# Patient Record
Sex: Male | Born: 1990
Health system: Southern US, Community
[De-identification: ages and names within clinical notes are randomized; demographics above are authoritative.]

## PROBLEM LIST (undated history)

## (undated) DIAGNOSIS — F32A Depression, unspecified: Secondary | ICD-10-CM

## (undated) DIAGNOSIS — F419 Anxiety disorder, unspecified: Secondary | ICD-10-CM

## (undated) DIAGNOSIS — F1910 Other psychoactive substance abuse, uncomplicated: Secondary | ICD-10-CM

## (undated) DIAGNOSIS — J45909 Unspecified asthma, uncomplicated: Secondary | ICD-10-CM

## (undated) DIAGNOSIS — F909 Attention-deficit hyperactivity disorder, unspecified type: Secondary | ICD-10-CM

## (undated) DIAGNOSIS — J449 Chronic obstructive pulmonary disease, unspecified: Secondary | ICD-10-CM

## (undated) HISTORY — DX: Other psychoactive substance abuse, uncomplicated: F19.10

## (undated) HISTORY — DX: Anxiety disorder, unspecified: F41.9

## (undated) HISTORY — PX: HAND SURGERY: SHX662

## (undated) HISTORY — PX: KNEE SURGERY: SHX244

## (undated) HISTORY — PX: CARPAL TUNNEL RELEASE: SHX101

## (undated) HISTORY — DX: Depression, unspecified: F32.A

---
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
## 2008-08-22 ENCOUNTER — Emergency Department (HOSPITAL_COMMUNITY): Admission: EM | Admit: 2008-08-22 | Discharge: 2008-08-22 | Payer: Self-pay | Admitting: Emergency Medicine

## 2010-09-12 NOTE — Op Note (Signed)
Jose Carr, Jose Carr                            ACCOUNT NO.:  [id number]   MEDICAL RECORD NO.:  [id number]                   PATIENT TYPE:  AMB   LOCATION:  [facility]                                 FACILITY:  [facility]   PHYSICIAN:  Jene Every, M.D.                 DATE OF BIRTH:  1991/04/05   DATE OF PROCEDURE:  08/13/2003  DATE OF DISCHARGE:                                 OPERATIVE REPORT   PREOPERATIVE DIAGNOSES:  Lateral meniscus tear, parameniscus cyst right  knee.   POSTOPERATIVE DIAGNOSES:  Lateral meniscus tear, parameniscus cyst right  knee, hypertrophic synovitis lateral compartment.   PROCEDURE:  Right knee arthroscopy, shaving the synovitis lateral  compartment followed by fenestration of the anterior horn of the lateral  meniscus, fenestration of parameniscal cyst.   ANESTHESIA:  General.   ASSISTANT:  None.   INDICATIONS FOR PROCEDURE:  This is a 20 year old with persistent knee pain,  MRI indicating paramenisical cyst laterally underneath the anterior horn of  the lateral meniscus. The patient had persistent symptoms of giving way and  locking suggesting a symptomatic meniscal tear although not definite by MRI.  Operative intervention was indicated for diagnosis and treatment of the  symptomatic meniscus tear,  evaluation of the cyst, possible excision of the  cyst, etc.   TECHNIQUE:  The patient in the supine position after an adequate level of  general anesthesia, 1 g of Kefzol, the right lower extremity was prepped and  draped in the usual sterile fashion.  A standard lateral parapatellar portal  was fashioned with a #11 blade as was the superomedial parapatellar portal.  The Ingress cannula atraumatically placed, irrigant was utilized to  insufflate the joint. The arthroscopic cannula were then inserted laterally.  Under direct visualization, a medial parapatellar portal was fashion with a  #11 blade after localization with an 18 gauge needle sparing the medial  meniscus.   Inspection of the suprapatellar pouch revealed normal patellofemoral  tracking, normal patellofemoral cartilage without evidence of  chondromalacia. The medial and lateral gutter were unremarkable.  Examination of the ACL and PCL indicated they were unremarkable. Examination  of the medial compartment revealed normal medial meniscus, normal tibial  plateau and normal femoral condyle.  Meniscus stable to propalpation without  evidence of tear.  Chondral surfaces were probed and as well they were  without evidence of significant chondral defect.   Examination of the lateral compartment revealed inspection of the lateral  meniscus on its cephalad and inferior portions probing it extensively.  We  found that there was no tear within the body of the meniscus itself nor the  anterior horn.  There was hypertrophic synovitis that seemed to be folding  into the lateral joint line.  The shaver was therefore introduced and  utilized to shave and debride the synovitis.  Anterolaterally in the  meniscocapsular junction there was what seemed to be a kind of  a mucoid  fraying of the junction here.  There was evidence of some mild tearing.  I  introduced an 18 gauge needle and fenestrated this region to obtain good  bleeding tissue. I then advanced the needle beneath the anterior horn of the  medial meniscus fenestrating the region of the parameniscal cyst noted on  the MRI.  Felt as if there was at least initially some slight gelatinous  fluid leaking from that.  Though again it was not very significant and there  was not a definite lesion that was visible.  Again I probed above and below  this meniscocapsular junction and I did feel that this represented a tearing  of that junction and the cyst represented some synovial fluid that basically  had leaked into this region beneath the meniscus. After fenestration and  seeing some good bleeding tissue there, felt that would hopefully allow  that  area to stimulation to heal.  On probing the meniscus, it did not seem to be  displaced significant as if this were an unstable meniscus.  I switched the  scope to the medial portal and evaluated from there, probed from the lateral  portal lifting up underneath the meniscus and on top of it extending down  over the tibial plateau. The articular portion of the tibial plateau again  in the region where the meniscal cyst was seen on the MRI and I saw no  evidence of it there at that time.  I felt we thoroughly probed and  evaluated the area consistent with that seen on the MRI.  After the  fenestration, I reduced the pressure, it was at 70 mmHg inside the joint.  There was good bleeding noted at the area of the fenestration.  Next I again  reexamined the gutters and looked at all compartments and there was no loose  cartilaginous debris or any residual pathology.  Again the ACL and PCL were  unremarkable.   Next, all instrumentation was removed, portals were closed with 4-0 nylon  simple suture, 0.25% Marcaine with epinephrine was infiltrated in the joint,  the wound was dressed sterilely.  The patient was awakened without  difficulty and transported to the recovery room in satisfactory condition.   The patient tolerated the procedure well with no complications.                                               Jene Every, M.D.    Jose Carr  D:  08/13/2003  T:  08/14/2003  Job:  [id number]

## 2010-11-28 ENCOUNTER — Emergency Department (HOSPITAL_COMMUNITY): Payer: Self-pay

## 2010-11-28 ENCOUNTER — Emergency Department (HOSPITAL_COMMUNITY)
Admission: EM | Admit: 2010-11-28 | Discharge: 2010-11-28 | Disposition: A | Discharge status: Home / Self Care | Payer: Self-pay | Attending: Emergency Medicine | Admitting: Emergency Medicine

## 2010-11-28 DIAGNOSIS — M79609 Pain in unspecified limb: Secondary | ICD-10-CM | POA: Insufficient documentation

## 2010-12-26 ENCOUNTER — Emergency Department (HOSPITAL_COMMUNITY): Payer: Self-pay

## 2010-12-26 ENCOUNTER — Emergency Department (HOSPITAL_COMMUNITY)
Admission: EM | Admit: 2010-12-26 | Discharge: 2010-12-26 | Disposition: A | Discharge status: Home / Self Care | Payer: Self-pay | Attending: Emergency Medicine | Admitting: Emergency Medicine

## 2010-12-26 DIAGNOSIS — IMO0002 Reserved for concepts with insufficient information to code with codable children: Secondary | ICD-10-CM | POA: Insufficient documentation

## 2010-12-26 DIAGNOSIS — S20219A Contusion of unspecified front wall of thorax, initial encounter: Secondary | ICD-10-CM | POA: Insufficient documentation

## 2010-12-26 DIAGNOSIS — R079 Chest pain, unspecified: Secondary | ICD-10-CM | POA: Insufficient documentation

## 2013-07-28 ENCOUNTER — Emergency Department (HOSPITAL_BASED_OUTPATIENT_CLINIC_OR_DEPARTMENT_OTHER)
Admission: EM | Admit: 2013-07-28 | Discharge: 2013-07-29 | Disposition: A | Discharge status: Home / Self Care | Payer: 59 | Attending: Emergency Medicine | Admitting: Emergency Medicine

## 2013-07-28 ENCOUNTER — Emergency Department (HOSPITAL_BASED_OUTPATIENT_CLINIC_OR_DEPARTMENT_OTHER): Payer: 59

## 2013-07-28 ENCOUNTER — Encounter (HOSPITAL_BASED_OUTPATIENT_CLINIC_OR_DEPARTMENT_OTHER): Payer: Self-pay | Admitting: Emergency Medicine

## 2013-07-28 DIAGNOSIS — J329 Chronic sinusitis, unspecified: Secondary | ICD-10-CM

## 2013-07-28 DIAGNOSIS — R599 Enlarged lymph nodes, unspecified: Secondary | ICD-10-CM | POA: Insufficient documentation

## 2013-07-28 DIAGNOSIS — J45901 Unspecified asthma with (acute) exacerbation: Secondary | ICD-10-CM | POA: Insufficient documentation

## 2013-07-28 DIAGNOSIS — F17200 Nicotine dependence, unspecified, uncomplicated: Secondary | ICD-10-CM | POA: Insufficient documentation

## 2013-07-28 DIAGNOSIS — B9789 Other viral agents as the cause of diseases classified elsewhere: Secondary | ICD-10-CM | POA: Insufficient documentation

## 2013-07-28 DIAGNOSIS — B349 Viral infection, unspecified: Secondary | ICD-10-CM

## 2013-07-28 HISTORY — DX: Unspecified asthma, uncomplicated: J45.909

## 2013-07-28 MED ORDER — ALBUTEROL SULFATE (2.5 MG/3ML) 0.083% IN NEBU
5.0000 mg | INHALATION_SOLUTION | Freq: Once | RESPIRATORY_TRACT | Status: AC
Start: 2013-07-28 — End: 2013-07-28
  Administered 2013-07-28: 5 mg via RESPIRATORY_TRACT
  Filled 2013-07-28: qty 6

## 2013-07-28 NOTE — ED Notes (Signed)
C/o prod cough x 1 week-pt NAD-talking in full sentences

## 2013-07-29 MED ORDER — PREDNISONE 50 MG PO TABS: 50.0000 mg | ORAL_TABLET | Freq: Every day | ORAL | Status: DC

## 2013-07-29 MED ORDER — BENZONATATE 100 MG PO CAPS: 100.0000 mg | ORAL_CAPSULE | Freq: Three times a day (TID) | ORAL | Status: DC

## 2013-07-29 MED ORDER — OXYMETAZOLINE HCL 0.05 % NA SOLN
1.0000 | Freq: Two times a day (BID) | NASAL | Status: DC
Start: 2013-07-29 — End: 2013-08-26

## 2013-07-29 MED ORDER — SALINE SPRAY 0.65 % NA SOLN: 1.0000 | NASAL | Status: DC | PRN

## 2013-07-29 NOTE — Discharge Instructions (Signed)
We think what you have is a viral syndrome (resultant cough, sinusitis) - the treatment for which is symptomatic relief only, and your body will fight the infection off in a few days. We are prescribing you some meds for pain and fevers. See your primary care doctor in 1 week if the symptoms dont improve.   Upper Respiratory Infection, Adult An upper respiratory infection (URI) is also sometimes known as the common cold. The upper respiratory tract includes the nose, sinuses, throat, trachea, and bronchi. Bronchi are the airways leading to the lungs. Most people improve within 1 week, but symptoms can last up to 2 weeks. A residual cough may last even longer.  CAUSES Many different viruses can infect the tissues lining the upper respiratory tract. The tissues become irritated and inflamed and often become very moist. Mucus production is also common. A cold is contagious. You can easily spread the virus to others by oral contact. This includes kissing, sharing a glass, coughing, or sneezing. Touching your mouth or nose and then touching a surface, which is then touched by another person, can also spread the virus. SYMPTOMS  Symptoms typically develop 1 to 3 days after you come in contact with a cold virus. Symptoms vary from person to person. They may include:  Runny nose.  Sneezing.  Nasal congestion.  Sinus irritation.  Sore throat.  Loss of voice (laryngitis).  Cough.  Fatigue.  Muscle aches.  Loss of appetite.  Headache.  Low-grade fever. DIAGNOSIS  You might diagnose your own cold based on familiar symptoms, since most people get a cold 2 to 3 times a year. Your caregiver can confirm this based on your exam. Most importantly, your caregiver can check that your symptoms are not due to another disease such as strep throat, sinusitis, pneumonia, asthma, or epiglottitis. Blood tests, throat tests, and X-rays are not necessary to diagnose a common cold, but they may sometimes be  helpful in excluding other more serious diseases. Your caregiver will decide if any further tests are required. RISKS AND COMPLICATIONS  You may be at risk for a more severe case of the common cold if you smoke cigarettes, have chronic heart disease (such as heart failure) or lung disease (such as asthma), or if you have a weakened immune system. The very young and very old are also at risk for more serious infections. Bacterial sinusitis, middle ear infections, and bacterial pneumonia can complicate the common cold. The common cold can worsen asthma and chronic obstructive pulmonary disease (COPD). Sometimes, these complications can require emergency medical care and may be life-threatening. PREVENTION  The best way to protect against getting a cold is to practice good hygiene. Avoid oral or hand contact with people with cold symptoms. Wash your hands often if contact occurs. There is no clear evidence that vitamin C, vitamin E, echinacea, or exercise reduces the chance of developing a cold. However, it is always recommended to get plenty of rest and practice good nutrition. TREATMENT  Treatment is directed at relieving symptoms. There is no cure. Antibiotics are not effective, because the infection is caused by a virus, not by bacteria. Treatment may include:  Increased fluid intake. Sports drinks offer valuable electrolytes, sugars, and fluids.  Breathing heated mist or steam (vaporizer or shower).  Eating chicken soup or other clear broths, and maintaining good nutrition.  Getting plenty of rest.  Using gargles or lozenges for comfort.  Controlling fevers with ibuprofen or acetaminophen as directed by your caregiver.  Increasing usage  of your inhaler if you have asthma. Zinc gel and zinc lozenges, taken in the first 24 hours of the common cold, can shorten the duration and lessen the severity of symptoms. Pain medicines may help with fever, muscle aches, and throat pain. A variety of  non-prescription medicines are available to treat congestion and runny nose. Your caregiver can make recommendations and may suggest nasal or lung inhalers for other symptoms.  HOME CARE INSTRUCTIONS   Only take over-the-counter or prescription medicines for pain, discomfort, or fever as directed by your caregiver.  Use a warm mist humidifier or inhale steam from a shower to increase air moisture. This may keep secretions moist and make it easier to breathe.  Drink enough water and fluids to keep your urine clear or pale yellow.  Rest as needed.  Return to work when your temperature has returned to normal or as your caregiver advises. You may need to stay home longer to avoid infecting others. You can also use a face mask and careful hand washing to prevent spread of the virus. SEEK MEDICAL CARE IF:   After the first few days, you feel you are getting worse rather than better.  You need your caregiver's advice about medicines to control symptoms.  You develop chills, worsening shortness of breath, or brown or red sputum. These may be signs of pneumonia.  You develop yellow or brown nasal discharge or pain in the face, especially when you bend forward. These may be signs of sinusitis.  You develop a fever, swollen neck glands, pain with swallowing, or white areas in the back of your throat. These may be signs of strep throat. SEEK IMMEDIATE MEDICAL CARE IF:   You have a fever.  You develop severe or persistent headache, ear pain, sinus pain, or chest pain.  You develop wheezing, a prolonged cough, cough up blood, or have a change in your usual mucus (if you have chronic lung disease).  You develop sore muscles or a stiff neck. Document Released: 10/07/2000 Document Revised: 07/06/2011 Document Reviewed: 08/15/2010 [facility] Patient Information 2014 [place], [place].  Viral Infections A viral infection can be caused by different types of viruses.Most viral infections are not serious  and resolve on their own. However, some infections may cause severe symptoms and may lead to further complications. SYMPTOMS Viruses can frequently cause:  Minor sore throat.  Aches and pains.  Headaches.  Runny nose.  Different types of rashes.  Watery eyes.  Tiredness.  Cough.  Loss of appetite.  Gastrointestinal infections, resulting in nausea, vomiting, and diarrhea. These symptoms do not respond to antibiotics because the infection is not caused by bacteria. However, you might catch a bacterial infection following the viral infection. This is sometimes called a "superinfection." Symptoms of such a bacterial infection may include:  Worsening sore throat with pus and difficulty swallowing.  Swollen neck glands.  Chills and a high or persistent fever.  Severe headache.  Tenderness over the sinuses.  Persistent overall ill feeling (malaise), muscle aches, and tiredness (fatigue).  Persistent cough.  Yellow, green, or brown mucus production with coughing. HOME CARE INSTRUCTIONS   Only take over-the-counter or prescription medicines for pain, discomfort, diarrhea, or fever as directed by your caregiver.  Drink enough water and fluids to keep your urine clear or pale yellow. Sports drinks can provide valuable electrolytes, sugars, and hydration.  Get plenty of rest and maintain proper nutrition. Soups and broths with crackers or rice are fine. SEEK IMMEDIATE MEDICAL CARE IF:  You have severe headaches, shortness of breath, chest pain, neck pain, or an unusual rash.  You have uncontrolled vomiting, diarrhea, or you are unable to keep down fluids.  You or your child has an oral temperature above 102 F (38.9 C), not controlled by medicine.  Your baby is older than 3 months with a rectal temperature of 102 F (38.9 C) or higher.  Your baby is 223 months old or younger with a rectal temperature of 100.4 F (38 C) or higher. MAKE SURE YOU:   Understand these  instructions.  Will watch your condition.  Will get help right away if you are not doing well or get worse. Document Released: 01/21/2005 Document Revised: 07/06/2011 Document Reviewed: 08/18/2010 [facility] Patient Information 2014 [place].  Sinusitis Sinusitis is redness, soreness, and swelling (inflammation) of the paranasal sinuses. Paranasal sinuses are air pockets within the bones of your face (beneath the eyes, the middle of the forehead, or above the eyes). In healthy paranasal sinuses, mucus is able to drain out, and air is able to circulate through them by way of your nose. However, when your paranasal sinuses are inflamed, mucus and air can become trapped. This can allow bacteria and other germs to grow and cause infection. Sinusitis can develop quickly and last only a short time (acute) or continue over a long period (chronic). Sinusitis that lasts for more than 12 weeks is considered chronic.  CAUSES  Causes of sinusitis include:  Allergies.  Structural abnormalities, such as displacement of the cartilage that separates your nostrils (deviated septum), which can decrease the air flow through your nose and sinuses and affect sinus drainage.  Functional abnormalities, such as when the small hairs (cilia) that line your sinuses and help remove mucus do not work properly or are not present. SYMPTOMS  Symptoms of acute and chronic sinusitis are the same. The primary symptoms are pain and pressure around the affected sinuses. Other symptoms include:  Upper toothache.  Earache.  Headache.  Bad breath.  Decreased sense of smell and taste.  A cough, which worsens when you are lying flat.  Fatigue.  Fever.  Thick drainage from your nose, which often is green and may contain pus (purulent).  Swelling and warmth over the affected sinuses. DIAGNOSIS  Your caregiver will perform a physical exam. During the exam, your caregiver may:  Look in your nose for signs of  abnormal growths in your nostrils (nasal polyps).  Tap over the affected sinus to check for signs of infection.  View the inside of your sinuses (endoscopy) with a special imaging device with a light attached (endoscope), which is inserted into your sinuses. If your caregiver suspects that you have chronic sinusitis, one or more of the following tests may be recommended:  Allergy tests.  Nasal culture A sample of mucus is taken from your nose and sent to a lab and screened for bacteria.  Nasal cytology A sample of mucus is taken from your nose and examined by your caregiver to determine if your sinusitis is related to an allergy. TREATMENT  Most cases of acute sinusitis are related to a viral infection and will resolve on their own within 10 days. Sometimes medicines are prescribed to help relieve symptoms (pain medicine, decongestants, nasal steroid sprays, or saline sprays).  However, for sinusitis related to a bacterial infection, your caregiver will prescribe antibiotic medicines. These are medicines that will help kill the bacteria causing the infection.  Rarely, sinusitis is caused by a fungal infection. In  theses cases, your caregiver will prescribe antifungal medicine. For some cases of chronic sinusitis, surgery is needed. Generally, these are cases in which sinusitis recurs more than 3 times per year, despite other treatments. HOME CARE INSTRUCTIONS   Drink plenty of water. Water helps thin the mucus so your sinuses can drain more easily.  Use a humidifier.  Inhale steam 3 to 4 times a day (for example, sit in the bathroom with the shower running).  Apply a warm, moist washcloth to your face 3 to 4 times a day, or as directed by your caregiver.  Use saline nasal sprays to help moisten and clean your sinuses.  Take over-the-counter or prescription medicines for pain, discomfort, or fever only as directed by your caregiver. SEEK IMMEDIATE MEDICAL CARE IF:  You have increasing  pain or severe headaches.  You have nausea, vomiting, or drowsiness.  You have swelling around your face.  You have vision problems.  You have a stiff neck.  You have difficulty breathing. MAKE SURE YOU:   Understand these instructions.  Will watch your condition.  Will get help right away if you are not doing well or get worse. Document Released: 04/13/2005 Document Revised: 07/06/2011 Document Reviewed: 04/28/2011 [facility] Patient Information 2014 [place], [place].

## 2013-07-29 NOTE — ED Provider Notes (Signed)
CSN: [id number]     Arrival date & time 07/28/13  2053 History   First MD Initiated Contact with Patient 07/28/13 2354     Chief Complaint  Patient presents with  . Cough     (Consider location/radiation/quality/duration/timing/severity/associated sxs/prior Treatment) HPI Comments:  Jose Carr is a 23 y.o. male who complains of congestion, sore throat, post nasal drip, dry cough, myalgias and headache for 7 days. He denies a history of chest pain, shortness of breath, sweats and weakness and has a history of asthma. Patient admits to smoke cigarettes. Pt's girl friend's daughter was having some URI like sx, and he believes he might have picked up the symptoms from her. He comes in to the ER, as he feels that he is now having lower lung involvement. No fevers or chills.  Patient is a 23 y.o. male presenting with cough. The history is provided by the patient.  Cough Associated symptoms: headaches, myalgias, rhinorrhea and wheezing   Associated symptoms: no chest pain and no shortness of breath     Past Medical History  Diagnosis Date  . Asthma    Past Surgical History  Procedure Laterality Date  . Knee surgery    . Hand surgery     No family history on file. History  Substance Use Topics  . Smoking status: Current Every Day Smoker    Types: Cigarettes  . Smokeless tobacco: Not on file  . Alcohol Use: No    Review of Systems  Constitutional: Negative for activity change and appetite change.  HENT: Positive for postnasal drip, rhinorrhea and sinus pressure.   Respiratory: Positive for cough and wheezing. Negative for shortness of breath.   Cardiovascular: Negative for chest pain.  Gastrointestinal: Negative for abdominal pain.  Genitourinary: Negative for dysuria.  Musculoskeletal: Positive for myalgias.  Neurological: Positive for headaches.  All other systems reviewed and are negative.      Allergies  Review of patient's allergies indicates no known  allergies.  Home Medications  No current outpatient prescriptions on file. BP 108/90  Pulse 70  Temp(Src) 98.5 F (36.9 C) (Oral)  Resp 18  Ht 5\' 7"  (1.702 m)  Wt 220 lb (99.791 kg)  BMI 34.45 kg/m2  SpO2 98% Physical Exam  Nursing note and vitals reviewed. Constitutional: He is oriented to person, place, and time. He appears well-developed.  HENT:  Head: Normocephalic and atraumatic.  Mouth/Throat: Oropharynx is clear and moist. No oropharyngeal exudate.  Eyes: Conjunctivae and EOM are normal. Pupils are equal, round, and reactive to light.  Neck: Normal range of motion. Neck supple.  Cardiovascular: Normal rate and regular rhythm.   Pulmonary/Chest: Effort normal and breath sounds normal. No stridor. No respiratory distress. He has no wheezes. He has no rales.  Abdominal: Soft. Bowel sounds are normal. He exhibits no distension. There is no tenderness. There is no rebound and no guarding.  Lymphadenopathy:    He has cervical adenopathy.  Neurological: He is alert and oriented to person, place, and time.  Skin: Skin is warm.    ED Course  Procedures (including critical care time) Labs Review Labs Reviewed - No data to display Imaging Review Dg Chest 2 View  07/28/2013   CLINICAL DATA:  Difficulty breathing.  EXAM: CHEST  2 VIEW  COMPARISON:  DG RIBS UNILATERAL W/CHEST*R* dated 12/26/2010  FINDINGS: Mediastinum and hilar structures are normal. Very noted infiltrate in the medial right lung base cannot be entirely excluded. No pleural effusion or pneumothorax. Heart size normal. No  acute bony abnormality.  IMPRESSION: Very minimal infiltrate medial right lung base cannot be excluded.   Electronically Signed   By: Maisie Fus  Register   On: 07/28/2013 23:05     EKG Interpretation None      MDM   Final diagnoses:  None   Pt comes in with URI like sx for the past few days, and subsequent cough.  DDX includes: Viral  syndrome Influenza Pharyngitis Sinusitis Mononucleosis Electrolyte abnormality  It appears that he has a viral URI and sinusitis based on his hx. There might be superimposed lower resp tract involvement.  Xrays shows possible infiltrate. Lung exam is clear, VSS, no fevers, and hx is viral sx like - so i don't think there is any reason for antibiotic tx.       Derwood Kaplan, MD 07/29/13 0030

## 2013-07-29 NOTE — ED Notes (Signed)
MD at bedside. 

## 2013-08-26 ENCOUNTER — Emergency Department (HOSPITAL_COMMUNITY): Payer: 59

## 2013-08-26 ENCOUNTER — Encounter (HOSPITAL_COMMUNITY): Payer: Self-pay | Admitting: Emergency Medicine

## 2013-08-26 ENCOUNTER — Emergency Department (HOSPITAL_COMMUNITY)
Admission: EM | Admit: 2013-08-26 | Discharge: 2013-08-26 | Disposition: A | Discharge status: Home / Self Care | Payer: 59 | Attending: Emergency Medicine | Admitting: Emergency Medicine

## 2013-08-26 DIAGNOSIS — J4489 Other specified chronic obstructive pulmonary disease: Secondary | ICD-10-CM | POA: Insufficient documentation

## 2013-08-26 DIAGNOSIS — F411 Generalized anxiety disorder: Secondary | ICD-10-CM | POA: Insufficient documentation

## 2013-08-26 DIAGNOSIS — J42 Unspecified chronic bronchitis: Secondary | ICD-10-CM

## 2013-08-26 DIAGNOSIS — J449 Chronic obstructive pulmonary disease, unspecified: Secondary | ICD-10-CM | POA: Insufficient documentation

## 2013-08-26 DIAGNOSIS — R0789 Other chest pain: Secondary | ICD-10-CM

## 2013-08-26 DIAGNOSIS — F17200 Nicotine dependence, unspecified, uncomplicated: Secondary | ICD-10-CM | POA: Insufficient documentation

## 2013-08-26 HISTORY — DX: Attention-deficit hyperactivity disorder, unspecified type: F90.9

## 2013-08-26 LAB — CBC
HCT: 48.4 % (ref 39.0–52.0)
Hemoglobin: 17.3 g/dL — ABNORMAL HIGH (ref 13.0–17.0)
MCH: 31.7 pg (ref 26.0–34.0)
MCHC: 35.7 g/dL (ref 30.0–36.0)
MCV: 88.6 fL (ref 78.0–100.0)
Platelets: 209 10*3/uL (ref 150–400)
RBC: 5.46 MIL/uL (ref 4.22–5.81)
RDW: 13.6 % (ref 11.5–15.5)
WBC: 11.1 10*3/uL — ABNORMAL HIGH (ref 4.0–10.5)

## 2013-08-26 LAB — BASIC METABOLIC PANEL
BUN: 8 mg/dL (ref 6–23)
CO2: 23 mEq/L (ref 19–32)
Calcium: 9.4 mg/dL (ref 8.4–10.5)
Chloride: 100 mEq/L (ref 96–112)
Creatinine, Ser: 0.95 mg/dL (ref 0.50–1.35)
GFR calc Af Amer: >90 mL/min (ref >90)
GFR calc non Af Amer: >90 mL/min (ref >90)
Glucose, Bld: 103 mg/dL — ABNORMAL HIGH (ref 70–99)
Potassium: 3.7 mEq/L (ref 3.7–5.3)
Sodium: 140 mEq/L (ref 137–147)

## 2013-08-26 LAB — I-STAT TROPONIN, ED: Troponin i, poc: 0 ng/mL (ref 0.00–0.08)

## 2013-08-26 MED ORDER — IBUPROFEN 800 MG PO TABS
800.0000 mg | ORAL_TABLET | Freq: Once | ORAL | Status: AC
  Administered 2013-08-26: 800 mg via ORAL
  Filled 2013-08-26: qty 1

## 2013-08-26 MED ORDER — METHOCARBAMOL 500 MG PO TABS: 500.0000 mg | ORAL_TABLET | Freq: Two times a day (BID) | ORAL | Status: DC

## 2013-08-26 MED ORDER — LORAZEPAM 1 MG PO TABS
1.0000 mg | ORAL_TABLET | Freq: Once | ORAL | Status: AC
  Administered 2013-08-26: 1 mg via ORAL
  Filled 2013-08-26: qty 1

## 2013-08-26 MED ORDER — PREDNISONE 20 MG PO TABS: 40.0000 mg | ORAL_TABLET | Freq: Every day | ORAL | Status: DC

## 2013-08-26 NOTE — ED Notes (Signed)
Pt c/o lt sided CP.  Describes the pain as deep and is very tender to palpation.  States that he has had this pain before but has never been seen for it.

## 2013-08-26 NOTE — ED Provider Notes (Signed)
CSN: [id number]     Arrival date & time 08/26/13  1621 History   First MD Initiated Contact with Patient 08/26/13 1625     Chief Complaint  Patient presents with  . Chest Pain     (Consider location/radiation/quality/duration/timing/severity/associated sxs/prior Treatment) HPI Comments: Patient is a 23 year old male past medical history significant for asthma, ADHD presenting to the emergency department for non-radiating left lower sided chest pain. He describes his pain as "deep" worsened with palpation and deep breathing. He states he has had this pain intermittently for one year but developed more constant severe pain while at work today. He denies any precipitating or aggravating factors. He has not been evaluated for this previously. He has not tried any over-the-counter medications to help with symptoms. Denies fevers, chills, nausea, vomiting, abdominal pain, diarrhea. No early familial cardiac history. PERC negative.   Patient is a 23 y.o. male presenting with chest pain.  Chest Pain   Past Medical History  Diagnosis Date  . Asthma   . ADHD (attention deficit hyperactivity disorder)    Past Surgical History  Procedure Laterality Date  . Knee surgery    . Hand surgery     History reviewed. No pertinent family history. History  Substance Use Topics  . Smoking status: Current Every Day Smoker    Types: Cigarettes  . Smokeless tobacco: Not on file  . Alcohol Use: No    Review of Systems  Cardiovascular: Positive for chest pain.  All other systems reviewed and are negative.     Allergies  Review of patient's allergies indicates no known allergies.  Home Medications   Prior to Admission medications   Not on File   BP 109/74  Pulse 98  Temp(Src) 98.1 F (36.7 C) (Oral)  Resp 18  SpO2 98% Physical Exam  Nursing note and vitals reviewed. Constitutional: He is oriented to person, place, and time. He appears well-developed and well-nourished. No distress.  HENT:   Head: Normocephalic and atraumatic.  Right Ear: External ear normal.  Left Ear: External ear normal.  Nose: Nose normal.  Mouth/Throat: Oropharynx is clear and moist. No oropharyngeal exudate.  Eyes: Conjunctivae are normal.  Neck: Neck supple.  Cardiovascular: Normal rate, regular rhythm and normal heart sounds.   Pulmonary/Chest: Effort normal and breath sounds normal. No accessory muscle usage or stridor. No respiratory distress. He exhibits tenderness. He exhibits no mass, no crepitus, no edema, no deformity, no swelling and no retraction.    Abdominal: Soft. There is no tenderness.  Musculoskeletal: Normal range of motion. He exhibits no edema.  Neurological: He is alert and oriented to person, place, and time.  Skin: Skin is warm and dry. He is not diaphoretic.  Psychiatric: His speech is normal. His mood appears anxious.    ED Course  Procedures (including critical care time) Medications  ibuprofen (ADVIL,MOTRIN) tablet 800 mg (800 mg Oral Given 08/26/13 1734)  LORazepam (ATIVAN) tablet 1 mg (1 mg Oral Given 08/26/13 1734)    Labs Review Labs Reviewed  CBC - Abnormal; Notable for the following:    WBC 11.1 (*)    Hemoglobin 17.3 (*)    All other components within normal limits  BASIC METABOLIC PANEL - Abnormal; Notable for the following:    Glucose, Bld 103 (*)    All other components within normal limits  I-STAT TROPOININ, ED    Imaging Review Dg Ribs Unilateral W/chest Left  08/26/2013   CLINICAL DATA:  Cough and shortness of breath. Smoker. Left rib  pain.  EXAM: LEFT RIBS AND CHEST - 3+ VIEW  COMPARISON:  Portable chest dated 08/26/2013.  FINDINGS: Normal sized heart. Clear lungs. The lungs remain mildly hyperexpanded with mild diffuse peribronchial thickening and accentuation of the interstitial markings. Normal appearing left ribs. Mild scoliosis.  IMPRESSION: No acute abnormality. Stable mild changes of COPD and chronic bronchitis.   Electronically Signed   By: Gordan PaymentSteve   Reid M.D.   On: 08/26/2013 17:36   Dg Chest Port 1 View  08/26/2013   CLINICAL DATA:  Cough.  Short of breath.  EXAM: PORTABLE CHEST - 1 VIEW  COMPARISON:  07/28/2013  FINDINGS: The heart size and mediastinal contours are within normal limits. Both lungs are clear. The visualized skeletal structures are unremarkable.  IMPRESSION: No active disease.   Electronically Signed   By: Amie Portlandavid  Ormond M.D.   On: 08/26/2013 16:54     EKG Interpretation   Date/Time:  Saturday Aug 26 2013 16:23:41 EDT Ventricular Rate:  77 PR Interval:  117 QRS Duration: 92 QT Interval:  357 QTC Calculation: 404 R Axis:   91 Text Interpretation:  Sinus rhythm Borderline short PR interval Borderline  right axis deviation No previous ECGs available Confirmed by YAO  MD,  DAVID ([id number]) on 08/26/2013 5:45:06 PM      MDM   Final diagnoses:  Chest pain, atypical  Chronic bronchitis    Filed Vitals:   08/26/13 1836  BP: 109/74  Pulse:   Temp: 98.1 F (36.7 C)  Resp: 18    Afebrile, NAD, non-toxic appearing, AAOx4.  Patient is to be discharged with recommendation to follow up with PCP in regards to today's hospital visit. Chest pain is not likely of cardiac or pulmonary etiology d/t presentation, perc negative, VSS, no tracheal deviation, no JVD or new murmur, RRR, breath sounds equal bilaterally, EKG without acute abnormalities, negative troponin. CXR reveals COPD and chronic bronchitic changes. Advised patient to consider tobacco cessation. Symptomatic measures discussed with patient. Pt has been advised to return to the ED is CP becomes exertional, associated with diaphoresis or nausea, radiates to left jaw/arm, worsens or becomes concerning in any way. Pt appears reliable for follow up and is agreeable to discharge. Case has been discussed with Dr. Silverio LayYao who agrees with the above plan to discharge.     Jeannetta EllisJennifer L Ciana Simmon, PA-C 08/26/13 2140

## 2013-08-26 NOTE — ED Notes (Addendum)
Pt reports left sided rib pain for over a year but worsening today. Pt reports worsening pain with deep breathing.

## 2013-08-26 NOTE — Discharge Instructions (Signed)
Please follow up with your primary care physician in 1-2 days. If you do not have one please call the [facility] number listed above. Please take Prednisone as prescribed to help inflammation in your ribs. Please take pain medication and/or muscle relaxants as prescribed and as needed for pain. Please do not drive on narcotic pain medication or on muscle relaxants. Please read all discharge instructions and return precautions.   Chest Wall Pain Chest wall pain is pain in or around the bones and muscles of your chest. It may take up to 6 weeks to get better. It may take longer if you must stay physically active in your work and activities.  CAUSES  Chest wall pain may happen on its own. However, it may be caused by:  A viral illness like the flu.  Injury.  Coughing.  Exercise.  Arthritis.  Fibromyalgia.  Shingles. HOME CARE INSTRUCTIONS   Avoid overtiring physical activity. Try not to strain or perform activities that cause pain. This includes any activities using your chest or your abdominal and side muscles, especially if heavy weights are used.  Put ice on the sore area.  Put ice in a plastic bag.  Place a towel between your skin and the bag.  Leave the ice on for 15-20 minutes per hour while awake for the first 2 days.  Only take over-the-counter or prescription medicines for pain, discomfort, or fever as directed by your caregiver. SEEK IMMEDIATE MEDICAL CARE IF:   Your pain increases, or you are very uncomfortable.  You have a fever.  Your chest pain becomes worse.  You have new, unexplained symptoms.  You have nausea or vomiting.  You feel sweaty or lightheaded.  You have a cough with phlegm (sputum), or you cough up blood. MAKE SURE YOU:   Understand these instructions.  Will watch your condition.  Will get help right away if you are not doing well or get worse. Document Released: 04/13/2005 Document Revised: 07/06/2011 Document  Reviewed: 12/08/2010 [facility] Patient Information 2014 [place] [place].  Establish relationship with primary care doctor as discussed. A resource guide and information on the Affordable Care Act has been provided for your information.    RESOURCE GUIDE  If you do not have a primary care doctor to follow up with regarding today's visit, please call the [facility] at [phone] to make an appointment. Hours of operation are 10am - 7pm, Monday through Friday, and they have a sliding scale fee.   Insufficient Money for Medicine: Contact United Way:  call "211" or [facility] [phone].  No Primary Care Doctor: - Call Health Connect  [phone] - can help you locate a primary care doctor that  accepts your insurance, provides certain services, etc. - Physician Referral [phone]- [phone]  Agencies that provide inexpensive medical care: - [facility]  [phone] - [facility]  [phone] - [facility]  [phone] [facility]- Women's Clinic  [phone] - [facility]  [phone]- [facility]  [phone]  Medicaid-accepting [place] [facility] Providers: - [facility]- [address], [address]  [phone], Mon-Fri 9am-7pm, Sat 9am-1pm - [facility]- [address], [address] [place]  [phone] - [facility]- [address], [address] [place]  [phone] [facility]- [facility]- [address]  [phone] - Renaye RakersVeita Bland- [address], [address], [phone]  Only accepts [place] Medicaid  patients after they have their name  applied to their card  Self Pay (no insurance) in [place] [facility] [place]: - Sickle Cell Patients: Dr Willey BladeEric Dean, [facility]  [address], [phone] - [facility]- [address]  [phone]       Patrcia Dolly-     [facility] [place]- [address], [address]       -     [facility]- see information above (Speak to Cit[employer]Pam H if you do not have insurance)       -  [facility]- [address], [phone]       -  [facility]- [address],  [phone]       -  [facility]- [address], [phone]       -  Dr Julio Sickssei-Bonsu-  [address], [address], [place], [phone]       -  [facility]- [address], [phone]       -  [facility]- [address], [phone], also [address], [phone]       -    [facility]- [address], [phone], 1st & 3rd Saturday   every month, 10am-1pm  1) Find a Doctor and Pay Out of Pocket Although you won't have to find out who is covered by your insurance plan, it is a good idea to ask around and get recommendations. You will then need to call the office and see if the doctor you have chosen will accept you as a new patient and what types of options they offer for patients who are self-pay. Some doctors offer discounts or will set up payment plans for their patients who do not have insurance, but you will need to ask so you aren't surprised when you get to your appointment.  2) Contact Your Local Health Department Not all health departments have doctors that can see patients for sick visits, but many do, so it is worth a call to see if yours does. If you don't know where your local health department is, you can check in your phone book. The CDC also has a tool to help you locate your state's health department, and many state websites also have listings of all of their local health departments.  3) Find a Walk-in Clinic If your illness is not likely to be very severe or complicated, you may want to try a walk in clinic. These are popping up all over the country in pharmacies, drugstores, and shopping centers. They're usually staffed by nurse practitioners or physician assistants that have been trained to treat common illnesses and complaints.  They're usually fairly quick and inexpensive. However, if you have serious medical issues or chronic medical problems, these are probably not your best option

## 2013-08-27 NOTE — ED Provider Notes (Signed)
Medical screening examination/treatment/procedure(s) were performed by non-physician practitioner and as supervising physician I was immediately available for consultation/collaboration.   EKG Interpretation   Date/Time:  Saturday Aug 26 2013 16:23:41 EDT Ventricular Rate:  77 PR Interval:  117 QRS Duration: 92 QT Interval:  357 QTC Calculation: 404 R Axis:   91 Text Interpretation:  Sinus rhythm Borderline short PR interval Borderline  right axis deviation No previous ECGs available Confirmed by Keliyah Spillman  MD,  Kenith Trickel ([id number]) on 08/26/2013 5:45:06 PM        Richardean Canalavid H Ashtin Melichar, MD 08/27/13 1459

## 2013-09-29 ENCOUNTER — Encounter (HOSPITAL_COMMUNITY): Payer: Self-pay | Admitting: Emergency Medicine

## 2013-09-29 ENCOUNTER — Emergency Department (HOSPITAL_COMMUNITY): Payer: 59

## 2013-09-29 ENCOUNTER — Emergency Department (HOSPITAL_COMMUNITY)
Admission: EM | Admit: 2013-09-29 | Discharge: 2013-09-29 | Disposition: A | Discharge status: Home / Self Care | Payer: 59 | Attending: Emergency Medicine | Admitting: Emergency Medicine

## 2013-09-29 DIAGNOSIS — J45909 Unspecified asthma, uncomplicated: Secondary | ICD-10-CM | POA: Insufficient documentation

## 2013-09-29 DIAGNOSIS — W010XXA Fall on same level from slipping, tripping and stumbling without subsequent striking against object, initial encounter: Secondary | ICD-10-CM | POA: Insufficient documentation

## 2013-09-29 DIAGNOSIS — S99929A Unspecified injury of unspecified foot, initial encounter: Secondary | ICD-10-CM

## 2013-09-29 DIAGNOSIS — F909 Attention-deficit hyperactivity disorder, unspecified type: Secondary | ICD-10-CM | POA: Insufficient documentation

## 2013-09-29 DIAGNOSIS — X500XXA Overexertion from strenuous movement or load, initial encounter: Secondary | ICD-10-CM | POA: Insufficient documentation

## 2013-09-29 DIAGNOSIS — F17200 Nicotine dependence, unspecified, uncomplicated: Secondary | ICD-10-CM | POA: Insufficient documentation

## 2013-09-29 DIAGNOSIS — Y939 Activity, unspecified: Secondary | ICD-10-CM | POA: Insufficient documentation

## 2013-09-29 DIAGNOSIS — S93609A Unspecified sprain of unspecified foot, initial encounter: Secondary | ICD-10-CM | POA: Insufficient documentation

## 2013-09-29 DIAGNOSIS — Y929 Unspecified place or not applicable: Secondary | ICD-10-CM | POA: Insufficient documentation

## 2013-09-29 DIAGNOSIS — Z79899 Other long term (current) drug therapy: Secondary | ICD-10-CM | POA: Insufficient documentation

## 2013-09-29 MED ORDER — HYDROCODONE-ACETAMINOPHEN 5-325 MG PO TABS
1.0000 | ORAL_TABLET | Freq: Once | ORAL | Status: AC
  Administered 2013-09-29: 1 via ORAL
  Filled 2013-09-29: qty 1

## 2013-09-29 MED ORDER — IBUPROFEN 800 MG PO TABS: 800.0000 mg | ORAL_TABLET | Freq: Three times a day (TID) | ORAL | Status: DC | PRN

## 2013-09-29 MED ORDER — HYDROCODONE-ACETAMINOPHEN 5-325 MG PO TABS: 1.0000 | ORAL_TABLET | Freq: Four times a day (QID) | ORAL | Status: DC | PRN

## 2013-09-29 NOTE — ED Notes (Signed)
Pt presents with c/o left foot injury. Pt says that he tripped and fell last night and now has some swelling to his foot. Pt does have a medium size knot to the top of his foot but he says that every time he rolls his foot the knot appears. Pt says that he is not able to bear weight on that foot at this time.

## 2013-09-29 NOTE — ED Provider Notes (Signed)
CSN: [id number]     Arrival date & time 09/29/13  2132 History  This chart was scribed for Ebbie Ridge, PA working with Linwood Dibbles, MD by Chestine Spore, ED Scribe. The patient was seen in room WTR8/WTR8 at 10:31 PM.     Chief Complaint  Patient presents with  . Foot Pain    The history is provided by the patient. No language interpreter was used.    Jose Carr is a 23 y.o. male who presents to the Emergency Department complaining of moderate left foot pain that began last night. Pt has a knot on the top of his foot that he claims appears whenever he rolls his foot. This has been ongoing for years due to a possible "popped tendon."  He states that he tripped and rolled his foot last night.  Since then he has had pain that is tolerable when sitting but severe when walking. Pt states he has been unable to sleep due to pain.  Patient, states he has not had a fever, weakness, dizziness, numbness, nausea, or vomiting  Past Medical History  Diagnosis Date  . Asthma   . ADHD (attention deficit hyperactivity disorder)    Past Surgical History  Procedure Laterality Date  . Knee surgery    . Hand surgery     No family history on file. History  Substance Use Topics  . Smoking status: Current Every Day Smoker    Types: Cigarettes  . Smokeless tobacco: Not on file  . Alcohol Use: No    Review of Systems A complete 10 system review of systems was obtained and all systems are negative except as noted in the HPI and PMH.     Allergies  Review of patient's allergies indicates no known allergies.  Home Medications   Prior to Admission medications   Medication Sig Start Date End Date Taking? Authorizing Provider  albuterol (PROVENTIL HFA;VENTOLIN HFA) 108 (90 BASE) MCG/ACT inhaler Inhale 1 puff into the lungs every 6 (six) hours as needed for wheezing or shortness of breath.   Yes Historical Provider, MD  HYDROcodone-acetaminophen (NORCO/VICODIN) 5-325 MG per tablet Take 1 tablet by mouth  every 6 (six) hours as needed for moderate pain. 09/29/13   Jamesetta Orleans Diezel Mazur, PA-C  ibuprofen (ADVIL,MOTRIN) 800 MG tablet Take 1 tablet (800 mg total) by mouth every 8 (eight) hours as needed. 09/29/13   Jamesetta Orleans Lessie Funderburke, PA-C   BP 123/99  Pulse 82  Temp(Src) 97.9 F (36.6 C) (Oral)  Resp 18  SpO2 98% Physical Exam  Nursing note and vitals reviewed. Constitutional: He is oriented to person, place, and time. He appears well-developed and well-nourished. No distress.  HENT:  Head: Normocephalic and atraumatic.  Eyes: Pupils are equal, round, and reactive to light.  Neck: Normal range of motion.  Pulmonary/Chest: Effort normal. No respiratory distress.  Musculoskeletal: He exhibits tenderness.  Swelling over mid-to-lateral left foot. Pain on palpation to the affected area. Good sensation and capillary refill. Decreased ROM in toes due to pain.  Neurological: He is alert and oriented to person, place, and time.  Skin: Skin is warm and dry. No rash noted. No erythema.    ED Course  Procedures (including critical care time)  DIAGNOSTIC STUDIES: Oxygen Saturation is 98% on room air, normal by my interpretation.    COORDINATION OF CARE: 10:32 PM-Discussed treatment plan which includes refferral to orthopedist, ice and elevation with pt at bedside and pt agreed to plan.   Labs Review Labs Reviewed - No  data to display  Imaging Review Dg Foot Complete Left  09/29/2013   CLINICAL DATA:  Foot trauma 1 day ago with persistent pain and swelling laterally.  EXAM: LEFT FOOT - COMPLETE 3+ VIEW  COMPARISON:  Left foot series dated November 28, 2010  FINDINGS: The bones are adequately mineralized for age. There is no acute fracture nor dislocation. The overlying soft tissues of the midfoot are prominent.  IMPRESSION: There is no acute fracture nor dislocation. There is soft tissue swelling over the midfoot.   Electronically Signed   By: David  [place]azil[place]Jordan   On: 09/29/2013 22:30    Patient is given a  plan and all questions were answered.  Told to return here as needed  Carlyle DollyChristopher W Arrielle Mcginn, PA-C 09/30/13 [phone]

## 2013-09-29 NOTE — Discharge Instructions (Signed)
Return here as needed. Ice and elevate the foot. Follow up with the orthopedist provided. The x-rays did not show any bony abnormality.

## 2013-09-30 NOTE — ED Provider Notes (Signed)
Medical screening examination/treatment/procedure(s) were performed by non-physician practitioner and as supervising physician I was immediately available for consultation/collaboration.    Linwood Dibbles, MD 09/30/13 [phone]

## 2013-10-05 ENCOUNTER — Emergency Department (HOSPITAL_COMMUNITY)
Admission: EM | Admit: 2013-10-05 | Discharge: 2013-10-05 | Disposition: A | Discharge status: Home / Self Care | Payer: 59 | Attending: Emergency Medicine | Admitting: Emergency Medicine

## 2013-10-05 ENCOUNTER — Encounter (HOSPITAL_COMMUNITY): Payer: Self-pay | Admitting: Emergency Medicine

## 2013-10-05 DIAGNOSIS — G8911 Acute pain due to trauma: Secondary | ICD-10-CM | POA: Insufficient documentation

## 2013-10-05 DIAGNOSIS — M79609 Pain in unspecified limb: Secondary | ICD-10-CM | POA: Insufficient documentation

## 2013-10-05 DIAGNOSIS — J45909 Unspecified asthma, uncomplicated: Secondary | ICD-10-CM | POA: Insufficient documentation

## 2013-10-05 DIAGNOSIS — S99922A Unspecified injury of left foot, initial encounter: Secondary | ICD-10-CM

## 2013-10-05 DIAGNOSIS — F17200 Nicotine dependence, unspecified, uncomplicated: Secondary | ICD-10-CM | POA: Insufficient documentation

## 2013-10-05 DIAGNOSIS — Z8659 Personal history of other mental and behavioral disorders: Secondary | ICD-10-CM | POA: Insufficient documentation

## 2013-10-05 NOTE — Discharge Instructions (Signed)
Cleared to return to work. Follow-up with orthopedist when able.  Continue home pain meds as needed. Return here as needed.

## 2013-10-05 NOTE — ED Provider Notes (Signed)
CSN: [id number]     Arrival date & time 10/05/13  1620 History  This chart was scribed for Sharilyn SitesLisa Burch Marchuk PA-C  working with Ethelda ChickMartha K Linker, MD by Ashley JacobsBrittany Andrews, ED scribe. This patient was seen in room WTR6/WTR6 and the patient's care was started at 5:06 PM.  First MD Initiated Contact with Patient 10/05/13 1658     Chief Complaint  Patient presents with  . Foot Pain    l/foot injury  . Follow-up     (Consider location/radiation/quality/duration/timing/severity/associated sxs/prior Treatment) The history is provided by the patient and medical records. No language interpreter was used.   HPI Comments: Jose RousselCory Carr is a 23 y.o. male who presents to the Emergency Department complaining of for a follow up after being seen on 6/5 for a slip and fall injury.  He has been wearing a CAM walker with improvement of symptoms.  States he was able to walk today in his normal work shoes without difficulty.  Today he requests a work note for clearance to return to his normal duties.  Denies numbness or paresthesias.  Pt plans to FU with orthopedics once his insurance takes effect in a few weeks.  Past Medical History  Diagnosis Date  . Asthma   . ADHD (attention deficit hyperactivity disorder)    Past Surgical History  Procedure Laterality Date  . Knee surgery    . Hand surgery     History reviewed. No pertinent family history. History  Substance Use Topics  . Smoking status: Current Every Day Smoker    Types: Cigarettes  . Smokeless tobacco: Not on file  . Alcohol Use: No    Review of Systems  Musculoskeletal: Positive for arthralgias and myalgias. Negative for gait problem.  All other systems reviewed and are negative.     Allergies  Review of patient's allergies indicates no known allergies.  Home Medications   Prior to Admission medications   Medication Sig Start Date End Date Taking? Authorizing Provider  albuterol (PROVENTIL HFA;VENTOLIN HFA) 108 (90 BASE) MCG/ACT inhaler  Inhale 1 puff into the lungs every 6 (six) hours as needed for wheezing or shortness of breath.    Historical Provider, MD  HYDROcodone-acetaminophen (NORCO/VICODIN) 5-325 MG per tablet Take 1 tablet by mouth every 6 (six) hours as needed for moderate pain. 09/29/13   Jamesetta Orleanshristopher W Lawyer, PA-C  ibuprofen (ADVIL,MOTRIN) 800 MG tablet Take 1 tablet (800 mg total) by mouth every 8 (eight) hours as needed. 09/29/13   Jamesetta Orleanshristopher W Lawyer, PA-C   BP 104/63  Pulse 88  Temp(Src) 98 F (36.7 C) (Oral)  Resp 16  SpO2 99%  Physical Exam  Nursing note and vitals reviewed. Constitutional: He is oriented to person, place, and time. He appears well-developed and well-nourished. No distress.  HENT:  Head: Normocephalic and atraumatic.  Mouth/Throat: Oropharynx is clear and moist.  Eyes: Conjunctivae and EOM are normal. Pupils are equal, round, and reactive to light.  Neck: Normal range of motion. Neck supple.  Cardiovascular: Normal rate, regular rhythm and normal heart sounds.   Pulmonary/Chest: Effort normal and breath sounds normal. No respiratory distress. He has no wheezes.  Musculoskeletal:       Left foot: Normal.  Left foot with soft tissue deformity along lateral aspect (unchanged for the past several years); full ROM without difficulty; DP pulse intact; normal sensation throughout foot; ambulating without difficulty  Neurological: He is alert and oriented to person, place, and time.  Skin: Skin is warm and dry. He is not  diaphoretic.  Psychiatric: He has a normal mood and affect.    ED Course  Procedures (including critical care time) DIAGNOSTIC STUDIES: Oxygen Saturation is 99% on room air, normal by my interpretation.    COORDINATION OF CARE:  5:06 PM Discussed course of care with pt . Pt understands and agrees.   Labs Review Labs Reviewed - No data to display  Imaging Review No results found.   EKG Interpretation None      MDM   Final diagnoses:  Injury of foot, left    Patient here for followup visit from one week ago. He sustained a slip and fall injury with pain to his left foot. Imaging at that time was negative. He is placed in a cam walker which he has been using with some improvement. He states he has been able to walk the entire day today in his work shoes without any pain or difficulty. On exam, his foot has the baseline deformity along lateral aspect, no new findings.  He was able to ambulate in front of me without difficulty. From my point of view, he is cleared to return to work. He states he will follow with orthopedics when his insurance takes effect.  He will continue his pain medication as needed.  Discussed plan with patient, he/she acknowledged understanding and agreed with plan of care.  Return precautions given for new or worsening symptoms.  Garlon Hatchet, PA-C 10/05/13 1827

## 2013-10-05 NOTE — ED Provider Notes (Signed)
Medical screening examination/treatment/procedure(s) were performed by non-physician practitioner and as supervising physician I was immediately available for consultation/collaboration.   EKG Interpretation None       Ethelda Chick, MD 10/05/13 [phone]

## 2013-10-05 NOTE — ED Notes (Signed)
Pt requesting medical clearance/follow up post l/foot injury. Pt did not follow up with ortho as directed

## 2014-02-16 ENCOUNTER — Emergency Department (HOSPITAL_COMMUNITY)
Admission: EM | Admit: 2014-02-16 | Discharge: 2014-02-16 | Disposition: A | Discharge status: Home / Self Care | Payer: 59 | Attending: Emergency Medicine | Admitting: Emergency Medicine

## 2014-02-16 ENCOUNTER — Encounter (HOSPITAL_COMMUNITY): Payer: Self-pay | Admitting: Emergency Medicine

## 2014-02-16 DIAGNOSIS — M79601 Pain in right arm: Secondary | ICD-10-CM | POA: Diagnosis present

## 2014-02-16 DIAGNOSIS — G5601 Carpal tunnel syndrome, right upper limb: Secondary | ICD-10-CM | POA: Diagnosis not present

## 2014-02-16 DIAGNOSIS — Z79899 Other long term (current) drug therapy: Secondary | ICD-10-CM | POA: Insufficient documentation

## 2014-02-16 DIAGNOSIS — M79641 Pain in right hand: Secondary | ICD-10-CM

## 2014-02-16 DIAGNOSIS — Z8659 Personal history of other mental and behavioral disorders: Secondary | ICD-10-CM | POA: Diagnosis not present

## 2014-02-16 DIAGNOSIS — J45909 Unspecified asthma, uncomplicated: Secondary | ICD-10-CM | POA: Diagnosis not present

## 2014-02-16 DIAGNOSIS — Z720 Tobacco use: Secondary | ICD-10-CM | POA: Insufficient documentation

## 2014-02-16 MED ORDER — TRAMADOL HCL 50 MG PO TABS: 50.0000 mg | ORAL_TABLET | Freq: Four times a day (QID) | ORAL | Status: DC | PRN

## 2014-02-16 MED ORDER — MELOXICAM 7.5 MG PO TABS: ORAL_TABLET | ORAL | Status: DC

## 2014-02-16 NOTE — ED Provider Notes (Signed)
CSN: [id number]     Arrival date & time 02/16/14  1351 History  This chart was scribed for non-physician practitioner, Junius FinnerErin O'Malley, PA-C working with Raeford RazorStephen Kohut, MD, by Jarvis Morganaylor Ferguson, ED Scribe. This patient was seen in room WTR8/WTR8 and the patient's care was started at 2:24 PM.    Chief Complaint  Patient presents with  . Hand Problem    The history is provided by the patient. No language interpreter was used.   HPI Comments: Jose Carr is a 23 y.o. male who presents to the Emergency Department complaining of right hand swelling and pain that began about 8 hours ago. He states this pain has been bothering him intermittently for 2 years. Pt states that the  pain is exacerbated by movement. He notes he hears a clicking motion when moving the fingers in his right hand. Pt states that the pain and swelling will alleviate when he holds his hand by his side. He notes he even sleeps with his hand hanging off the bed because it seems to help the pain and swelling. He states he is unable to make a full fist. He is having associated numbness and tingling in the right hand along with right wrist tenderness. Pt works with his hands all day at work. Pt had surgery on his right hand around 13 years ago for a fracture. He denies any color change to the area.    Past Medical History  Diagnosis Date  . Asthma   . ADHD (attention deficit hyperactivity disorder)    Past Surgical History  Procedure Laterality Date  . Knee surgery    . Hand surgery     History reviewed. No pertinent family history. History  Substance Use Topics  . Smoking status: Current Every Day Smoker    Types: Cigarettes  . Smokeless tobacco: Not on file  . Alcohol Use: No    Review of Systems  Musculoskeletal: Positive for arthralgias (right hand) and joint swelling (right hand).  Skin: Negative for color change.  Neurological: Positive for numbness (right hand).  All other systems reviewed and are  negative.    Allergies  Review of patient's allergies indicates no known allergies.  Home Medications   Prior to Admission medications   Medication Sig Start Date End Date Taking? Authorizing Provider  albuterol (PROVENTIL HFA;VENTOLIN HFA) 108 (90 BASE) MCG/ACT inhaler Inhale 1 puff into the lungs every 6 (six) hours as needed for wheezing or shortness of breath.    Historical Provider, MD  HYDROcodone-acetaminophen (NORCO/VICODIN) 5-325 MG per tablet Take 1 tablet by mouth every 6 (six) hours as needed for moderate pain. 09/29/13   Jamesetta Orleanshristopher W Lawyer, PA-C  ibuprofen (ADVIL,MOTRIN) 800 MG tablet Take 1 tablet (800 mg total) by mouth every 8 (eight) hours as needed. 09/29/13   Carlyle Dollyhristopher W Lawyer, PA-C  meloxicam (MOBIC) 7.5 MG tablet Take 1-2 tabs daily as needed for pain and inflammation 02/16/14   Junius FinnerErin O'Malley, PA-C  traMADol (ULTRAM) 50 MG tablet Take 1 tablet (50 mg total) by mouth every 6 (six) hours as needed. 02/16/14   Junius FinnerErin O'Malley, PA-C   Triage Vitals: BP 100/60  Pulse 81  Temp(Src) 97.9 F (36.6 C) (Oral)  Resp 16  SpO2 99%  Physical Exam  Nursing note and vitals reviewed. Constitutional: He is oriented to person, place, and time. He appears well-developed and well-nourished.  HENT:  Head: Normocephalic and atraumatic.  Eyes: EOM are normal.  Neck: Normal range of motion.  Cardiovascular: Normal rate.  Pulses:      Radial pulses are 2+ on the right side, and 2+ on the left side.  Pulmonary/Chest: Effort normal.  Musculoskeletal: Normal range of motion.  Positive tinel's sign on right wrist. Mild edema to the right hand. No snuff box tenderness. Mild tenderness on palmar aspect of right hand.  4/5 grip strength, right versus left Sensation intact  Neurological: He is alert and oriented to person, place, and time.  Skin: Skin is warm and dry.  Psychiatric: He has a normal mood and affect. His behavior is normal.    ED Course  Procedures (including critical  care time)  DIAGNOSTIC STUDIES: Oxygen Saturation is 99% on RA, normal by my interpretation.    COORDINATION OF CARE:    Labs Review Labs Reviewed - No data to display  Imaging Review No results found.   EKG Interpretation None      MDM   Final diagnoses:  Carpal tunnel syndrome, right  Right hand pain    Signs and symptoms c/w carpal tunnel syndrome. No hx of recent trauma.  Do not believe imaging needed at this time. Not concerned for emergent process taking place. Will tx symptomatically as needed for pain. Rx: wrist splint, tramadol, and mobic.  Advised to f/u with Dr. Mina MarbleWeingold, hand surgery, for further evaluation and treatment if not improving in 1-2 weeks. Home care instructions provided. Pt verbalized understanding and agreement with tx plan.   I personally performed the services described in this documentation, which was scribed in my presence. The recorded information has been reviewed and is accurate.    Junius Finnerrin O'Malley, PA-C 02/16/14 [phone]

## 2014-02-16 NOTE — Progress Notes (Signed)
Orthopedic Tech Progress Note Patient Details:  Jose RousselCory Reali 03/09/1991 [id number] Applied Velcro cock-up wrist splint to RUE.  Pulses, sensation, motion intact before and after application.  Capillary refill less than 2 seconds before and after application. Ortho Devices Type of Ortho Device: Velcro wrist splint Ortho Device/Splint Location: RUE Ortho Device/Splint Interventions: Application   Lesle ChrisGilliland, Sheera Illingworth L 02/16/2014, 2:45 PM

## 2014-02-16 NOTE — Discharge Instructions (Signed)
Carpal Tunnel Syndrome The carpal tunnel is a narrow area located on the palm side of your wrist. The tunnel is formed by the wrist bones and ligaments. Nerves, blood vessels, and tendons pass through the carpal tunnel. Repeated wrist motion or certain diseases may cause swelling within the tunnel. This swelling pinches the main nerve in the wrist (median nerve) and causes the painful hand and arm condition called carpal tunnel syndrome. CAUSES   Repeated wrist motions.  Wrist injuries.  Certain diseases like arthritis, diabetes, alcoholism, hyperthyroidism, and kidney failure.  Obesity.  Pregnancy. SYMPTOMS   A "pins and needles" feeling in your fingers or hand, especially in your thumb, index and middle fingers.  Tingling or numbness in your fingers or hand.  An aching feeling in your entire arm, especially when your wrist and elbow are bent for long periods of time.  Wrist pain that goes up your arm to your shoulder.  Pain that goes down into your palm or fingers.  A weak feeling in your hands. DIAGNOSIS  Your health care provider will take your history and perform a physical exam. An electromyography test may be needed. This test measures electrical signals sent out by your nerves into the muscles. The electrical signals are usually slowed by carpal tunnel syndrome. You may also need X-rays. TREATMENT  Carpal tunnel syndrome may clear up by itself. Your health care provider may recommend a wrist splint or medicine such as a nonsteroidal anti-inflammatory medicine. Cortisone injections may help. Sometimes, surgery may be needed to free the pinched nerve.  HOME CARE INSTRUCTIONS   Take all medicine as directed by your health care provider. Only take over-the-counter or prescription medicines for pain, discomfort, or fever as directed by your health care provider.  If you were given a splint to keep your wrist from bending, wear it as directed. It is important to wear the splint at  night. Wear the splint for as long as you have pain or numbness in your hand, arm, or wrist. This may take 1 to 2 months.  Rest your wrist from any activity that may be causing your pain. If your symptoms are work-related, you may need to talk to your employer about changing to a job that does not require using your wrist.  Put ice on your wrist after long periods of wrist activity.  Put ice in a plastic bag.  Place a towel between your skin and the bag.  Leave the ice on for 15-20 minutes, 03-04 times a day.  Keep all follow-up visits as directed by your health care provider. This includes any orthopedic referrals, physical therapy, and rehabilitation. Any delay in getting necessary care could result in a delay or failure of your condition to heal. SEEK IMMEDIATE MEDICAL CARE IF:   You have new, unexplained symptoms.  Your symptoms get worse and are not helped or controlled with medicines. MAKE SURE YOU:   Understand these instructions.   Carpal Tunnel Release Carpal tunnel release is done to relieve the pressure on the nerves and tendons on the bottom side of your wrist.  LET YOUR CAREGIVER KNOW ABOUT:  Allergies to food or medicine. Medicines taken, including vitamins, herbs, eyedrops, over-the-counter medicines, and creams. Use of steroids (by mouth or creams). Previous problems with anesthetics or numbing medicines. History of bleeding problems or blood clots. Previous surgery. Other health problems, including diabetes and kidney problems. Possibility of pregnancy, if this applies. RISKS AND COMPLICATIONS  Some problems that may happen after this procedure  include: Infection. Damage to the nerves, arteries or tendons could occur. This would be very uncommon. Bleeding. BEFORE THE PROCEDURE  This surgery may be done while you are asleep (general anesthetic) or may be done under a block where only your forearm and the surgical area is numb. If the surgery is done under a  block, the numbness will gradually wear off within several hours after surgery. HOME CARE INSTRUCTIONS  Have a responsible person with you for 24 hours. Do not drive a car or use public transportation for 24 hours. Only take over-the-counter or prescription medicines for pain, discomfort, or fever as directed by your caregiver. Take them as directed. You may put ice on the palm side of the affected wrist. Put ice in a plastic bag. Place a towel between your skin and the bag. Leave the ice on for 20 to 30 minutes, 4 times per day. If you were given a splint to keep your wrist from bending, use it as directed. It is important to wear the splint at night or as directed. Use the splint for as long as you have pain or numbness in your hand, arm, or wrist. This may take 1 to 2 months. Keep your hand raised (elevated) above the level of your heart as much as possible. This keeps swelling down and helps with discomfort. Change bandages (dressings) as directed. Keep the wound clean and dry. SEEK MEDICAL CARE IF:  You develop pain not relieved with medications. You develop numbness of your hand. You develop bleeding from your surgical site. You have an oral temperature above 102 F (38.9 C). You develop redness or swelling of the surgical site. You develop new, unexplained problems. SEEK IMMEDIATE MEDICAL CARE IF:  You develop a rash. You have difficulty breathing. You develop any reaction or side effects to medications given. Document Released: 07/04/2003 Document Revised: 07/06/2011 Document Reviewed: 02/17/2007 [facility] Patient Information 2015 [place], [place]. This information is not intended to replace advice given to you by your health care provider. Make sure you discuss any questions you have with your health care provider.   Will watch your condition.  Will get help right away if you are not doing well or get worse. Document Released: 04/10/2000 Document Revised: 08/28/2013 Document  Reviewed: 02/27/2011 [facility] Patient Information 2015 [place]. This information is not intended to replace advice given to you by your health care provider. Make sure you discuss any questions you have with your health care provider.

## 2014-02-16 NOTE — ED Notes (Addendum)
Pt reports he has R hand swelling that started at 6am today. Pt reports this happens whenever he works and has his hand clenched all day. Has had this problem for 2 years. Pain decreased with lower hand below body level. Has sensation of index finger joint popping. Has had hand surgery on R hand before

## 2014-02-19 NOTE — ED Provider Notes (Signed)
Medical screening examination/treatment/procedure(s) were performed by non-physician practitioner and as supervising physician I was immediately available for consultation/collaboration.   EKG Interpretation None       Hung Rhinesmith, MD 02/19/14 1303 

## 2014-04-07 ENCOUNTER — Encounter (HOSPITAL_COMMUNITY): Payer: Self-pay | Admitting: Emergency Medicine

## 2014-04-07 ENCOUNTER — Emergency Department (HOSPITAL_COMMUNITY): Payer: 59

## 2014-04-07 ENCOUNTER — Emergency Department (HOSPITAL_COMMUNITY)
Admission: EM | Admit: 2014-04-07 | Discharge: 2014-04-07 | Disposition: A | Discharge status: Home / Self Care | Payer: 59 | Attending: Emergency Medicine | Admitting: Emergency Medicine

## 2014-04-07 DIAGNOSIS — Z720 Tobacco use: Secondary | ICD-10-CM | POA: Diagnosis not present

## 2014-04-07 DIAGNOSIS — Y998 Other external cause status: Secondary | ICD-10-CM | POA: Insufficient documentation

## 2014-04-07 DIAGNOSIS — X58XXXA Exposure to other specified factors, initial encounter: Secondary | ICD-10-CM | POA: Insufficient documentation

## 2014-04-07 DIAGNOSIS — S90511A Abrasion, right ankle, initial encounter: Secondary | ICD-10-CM | POA: Insufficient documentation

## 2014-04-07 DIAGNOSIS — Y9289 Other specified places as the place of occurrence of the external cause: Secondary | ICD-10-CM | POA: Diagnosis not present

## 2014-04-07 DIAGNOSIS — Y9389 Activity, other specified: Secondary | ICD-10-CM | POA: Insufficient documentation

## 2014-04-07 DIAGNOSIS — Z79899 Other long term (current) drug therapy: Secondary | ICD-10-CM | POA: Insufficient documentation

## 2014-04-07 DIAGNOSIS — M25532 Pain in left wrist: Secondary | ICD-10-CM | POA: Insufficient documentation

## 2014-04-07 DIAGNOSIS — S9001XA Contusion of right ankle, initial encounter: Secondary | ICD-10-CM | POA: Diagnosis not present

## 2014-04-07 DIAGNOSIS — Z791 Long term (current) use of non-steroidal anti-inflammatories (NSAID): Secondary | ICD-10-CM | POA: Insufficient documentation

## 2014-04-07 DIAGNOSIS — M25531 Pain in right wrist: Secondary | ICD-10-CM | POA: Diagnosis not present

## 2014-04-07 DIAGNOSIS — J45909 Unspecified asthma, uncomplicated: Secondary | ICD-10-CM | POA: Insufficient documentation

## 2014-04-07 DIAGNOSIS — S99911A Unspecified injury of right ankle, initial encounter: Secondary | ICD-10-CM | POA: Diagnosis present

## 2014-04-07 DIAGNOSIS — T1490XA Injury, unspecified, initial encounter: Secondary | ICD-10-CM

## 2014-04-07 MED ORDER — TRAMADOL HCL 50 MG PO TABS
50.0000 mg | ORAL_TABLET | Freq: Four times a day (QID) | ORAL | Status: DC | PRN
Start: 2014-04-07 — End: 2014-12-05

## 2014-04-07 NOTE — ED Notes (Signed)
Pt is very upset that we can not treat his chronic pain issues. Pt stated that he will follow up with ortho after the firsty of the year due to insurance issue.

## 2014-04-07 NOTE — ED Notes (Signed)
Ice pack provided

## 2014-04-07 NOTE — ED Notes (Signed)
Second call for pt for triage. No answer.

## 2014-04-07 NOTE — ED Notes (Signed)
Pt requesting to speak to the person in charge. Consulting civil engineerCharge RN notified

## 2014-04-07 NOTE — ED Notes (Signed)
Pt from home c/o right ankle pain from rolling his ankle two days ago. Patient also c/o carpal tunnel pain.

## 2014-04-07 NOTE — ED Notes (Signed)
Pt spoke with Randye LoboP Dowd RN, Press photographerCharge Nurse. Pain management and follow up reviewed with pt.

## 2014-04-07 NOTE — ED Provider Notes (Signed)
CSN: [id number]     Arrival date & time 04/07/14  1706 History   First MD Initiated Contact with Patient 04/07/14 1844     This chart was scribed for non-physician practitioner, Teressa LowerVrinda Tinlee Navarrette, NP working with Tilden FossaElizabeth Rees, MD by Arlan OrganAshley Leger, ED Scribe. This patient was seen in room WTR7/WTR7 and the patient's care was started at 6:46 PM.   Chief Complaint  Patient presents with  . Ankle Pain  . Carpal Tunnel   The history is provided by the patient. No language interpreter was used.    HPI Comments: Jose Carr is a 23 y.o. male who presents to the Emergency Department complaining of constant, moderate R ankle pain x 2 days that has progressively worsened. Pt states he was playing with a puppy when he rolled his ankle laterally. He states he heard a loud "pop" to his ankle after rolling. Pain is exacerbated with ambulation and weight bearing. Discomfort is somewhat alleviated when rested and elevated. He has tried OTC Tylenol and Ibuprofen without any improvement for symptoms. No recent fever or chills. Jose Carr is not currently followed by an orthopedist. No known allergies to medications.  Past Medical History  Diagnosis Date  . Asthma   . ADHD (attention deficit hyperactivity disorder)    Past Surgical History  Procedure Laterality Date  . Knee surgery    . Hand surgery     No family history on file. History  Substance Use Topics  . Smoking status: Current Every Day Smoker    Types: Cigarettes  . Smokeless tobacco: Not on file  . Alcohol Use: No    Review of Systems  Constitutional: Negative for fever and chills.  Musculoskeletal: Positive for arthralgias.  All other systems reviewed and are negative.     Allergies  Review of patient's allergies indicates no known allergies.  Home Medications   Prior to Admission medications   Medication Sig Start Date End Date Taking? Authorizing Provider  albuterol (PROVENTIL HFA;VENTOLIN HFA) 108 (90 BASE) MCG/ACT  inhaler Inhale 1 puff into the lungs every 6 (six) hours as needed for wheezing or shortness of breath.    Historical Provider, MD  HYDROcodone-acetaminophen (NORCO/VICODIN) 5-325 MG per tablet Take 1 tablet by mouth every 6 (six) hours as needed for moderate pain. 09/29/13   Jamesetta Orleanshristopher W Lawyer, PA-C  ibuprofen (ADVIL,MOTRIN) 800 MG tablet Take 1 tablet (800 mg total) by mouth every 8 (eight) hours as needed. 09/29/13   Carlyle Dollyhristopher W Lawyer, PA-C  meloxicam (MOBIC) 7.5 MG tablet Take 1-2 tabs daily as needed for pain and inflammation 02/16/14   Junius FinnerErin O'Malley, PA-C  traMADol (ULTRAM) 50 MG tablet Take 1 tablet (50 mg total) by mouth every 6 (six) hours as needed. 02/16/14   Junius FinnerErin O'Malley, PA-C   Triage Vitals: BP 134/67 mmHg  Pulse 69  Temp(Src) 97.1 F (36.2 C) (Oral)  Resp 18  SpO2 100%   Physical Exam  Constitutional: He is oriented to person, place, and time. He appears well-developed and well-nourished.  HENT:  Head: Normocephalic.  Eyes: EOM are normal.  Neck: Normal range of motion.  Pulmonary/Chest: Effort normal.  Abdominal: He exhibits no distension.  Musculoskeletal: Normal range of motion.  Bruising noted to the lateral right ankle. Full rom.  Neurological: He is alert and oriented to person, place, and time.  Psychiatric: He has a normal mood and affect.  Nursing note and vitals reviewed.   ED Course  Procedures (including critical care time)  DIAGNOSTIC STUDIES: Oxygen Saturation  is 100% on RA, Normal by my interpretation.    COORDINATION OF CARE: 6:46 PM- Will order DG Ankle Complete R. Discussed treatment plan with pt at bedside and pt agreed to plan.     Labs Review Labs Reviewed - No data to display  Imaging Review Dg Ankle Complete Right  04/07/2014   CLINICAL DATA:  Patient complains of right ankle pain after "hearing his ankle pop" while playing with puppy dog outside X 1 day.  EXAM: RIGHT ANKLE - COMPLETE 3+ VIEW  COMPARISON:  None.  FINDINGS: No  fracture. Ankle mortise is normally space and aligned. No arthropathic change  Mild diffuse nonspecific soft tissue edema.  IMPRESSION: No fracture or ankle joint abnormality.   Electronically Signed   By: Amie Portlandavid  Ormond M.D.   On: 04/07/2014 18:33     EKG Interpretation None      MDM   Final diagnoses:  Ankle abrasion, right, initial encounter  Pain in both wrists    No bony abnormality noted. Pt has full rom of both wrists. Pt refusing crutches but did take the aso. Pt states that he doesn't want ultram. Would rather have flexeril  I personally performed the services described in this documentation, which was scribed in my presence. The recorded information has been reviewed and is accurate.    Teressa LowerVrinda Bernardette Waldron, NP 04/07/14 2012  Tilden FossaElizabeth Rees, MD 04/08/14 [phone]

## 2014-04-07 NOTE — ED Notes (Signed)
Called patient for the first attempt

## 2014-04-07 NOTE — Discharge Instructions (Signed)

## 2014-08-19 IMAGING — CR DG CHEST 1V PORT
1 series · 1 of 1 positions shown · non-contrast
Comparison: 07/28/2013

CLINICAL DATA: Cough.  Short of breath.

EXAM:
PORTABLE CHEST - 1 VIEW

[AP]
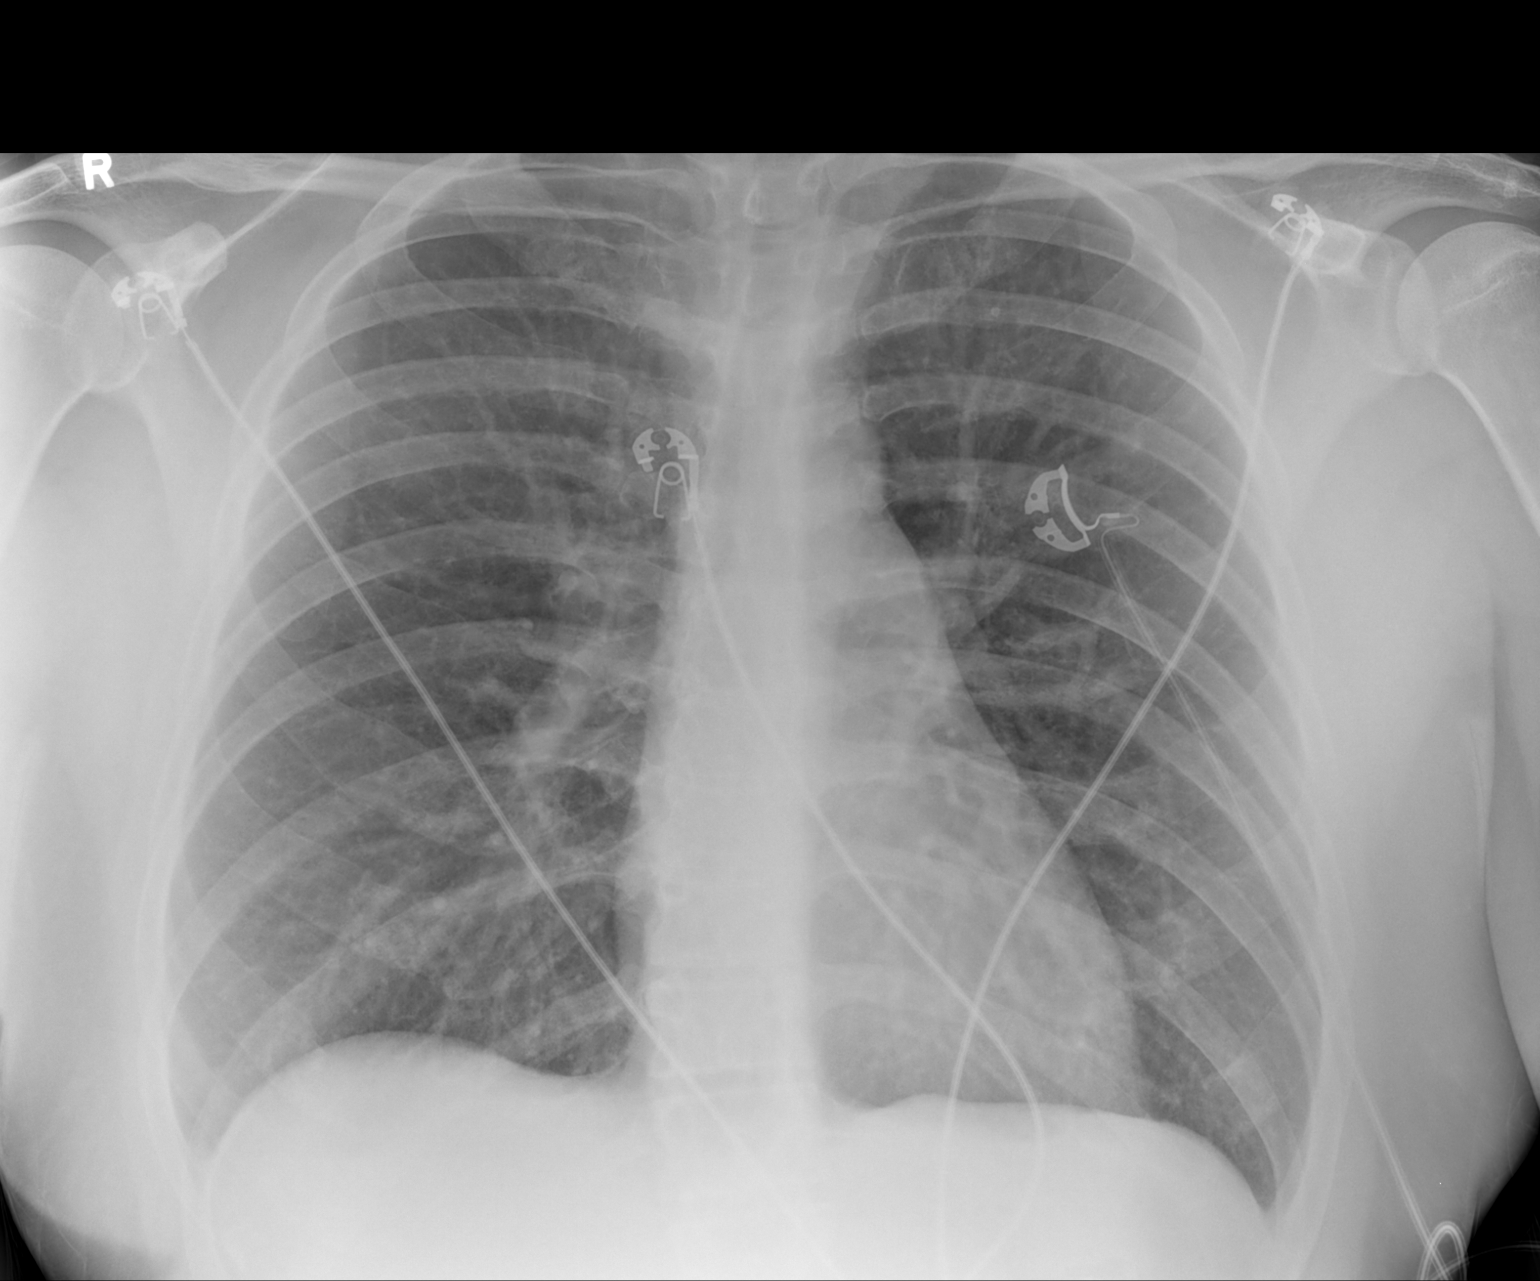

[1 of 1 positions shown; findings below may reference images not displayed]

FINDINGS: The heart size and mediastinal contours are within normal limits.
Both lungs are clear. The visualized skeletal structures are
unremarkable.
IMPRESSION: No active disease.

## 2014-08-19 IMAGING — CR DG RIBS W/ CHEST 3+V*L*
4 series · 4 of 4 positions shown · non-contrast
Comparison: Portable chest dated 08/26/2013.

CLINICAL DATA: Cough and shortness of breath. Smoker. Left rib
pain.

EXAM:
LEFT RIBS AND CHEST - 3+ VIEW

[w chest pa]
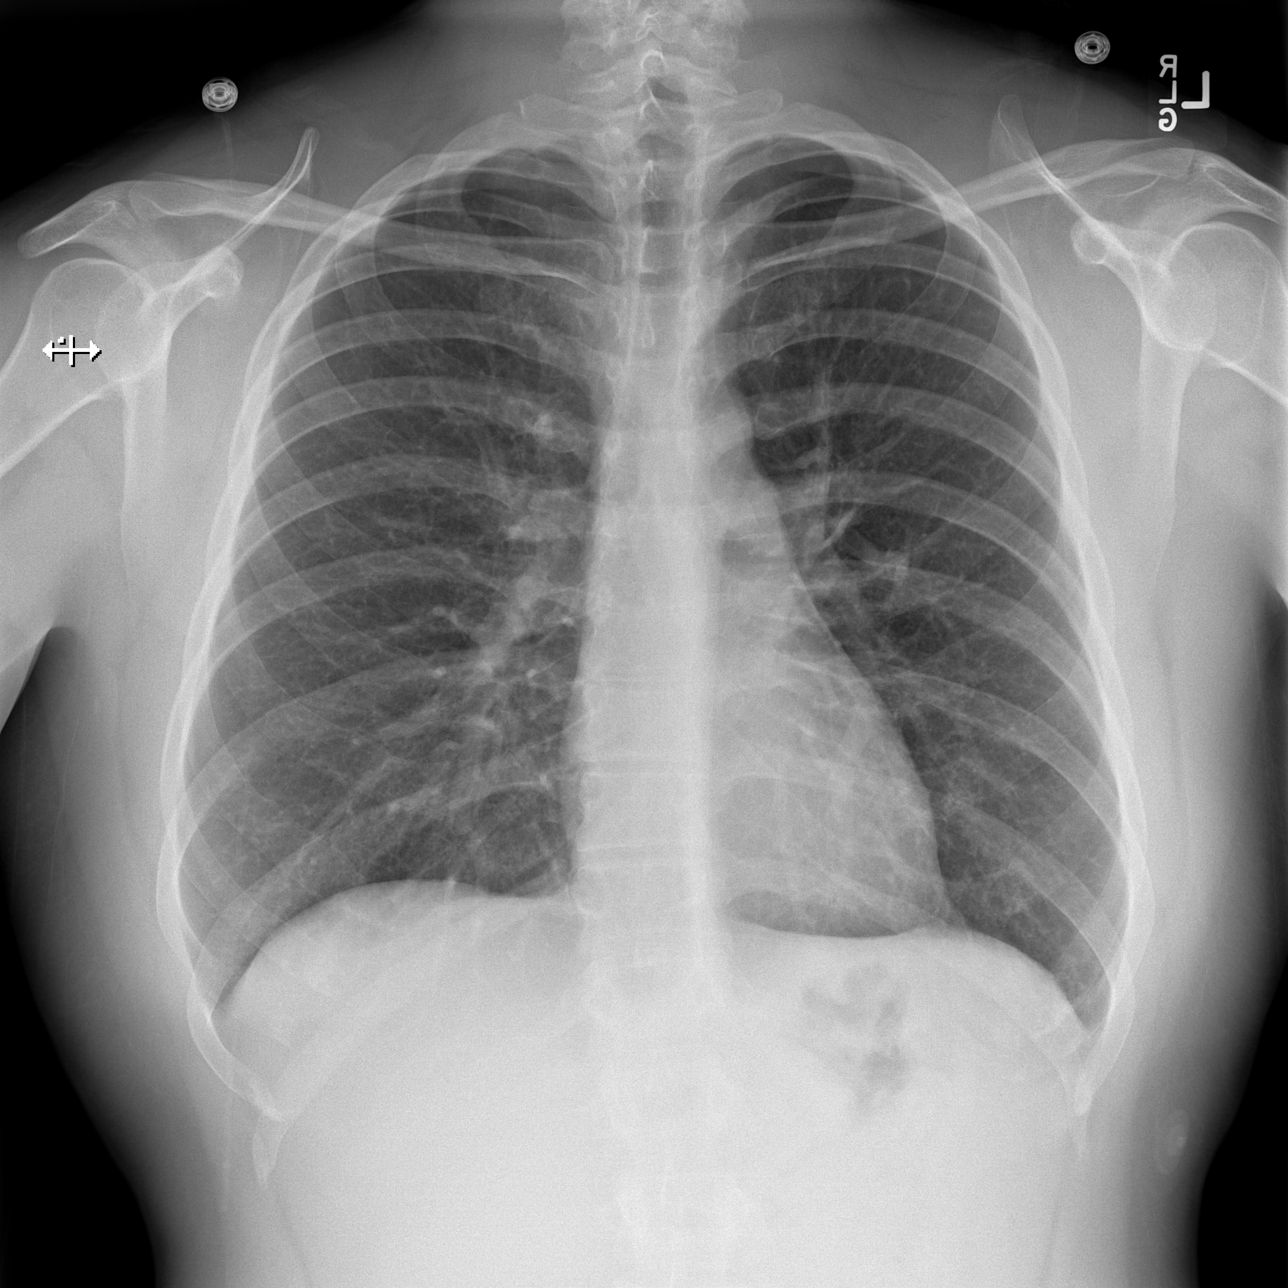

[w ribs obl left (1 of 3)]
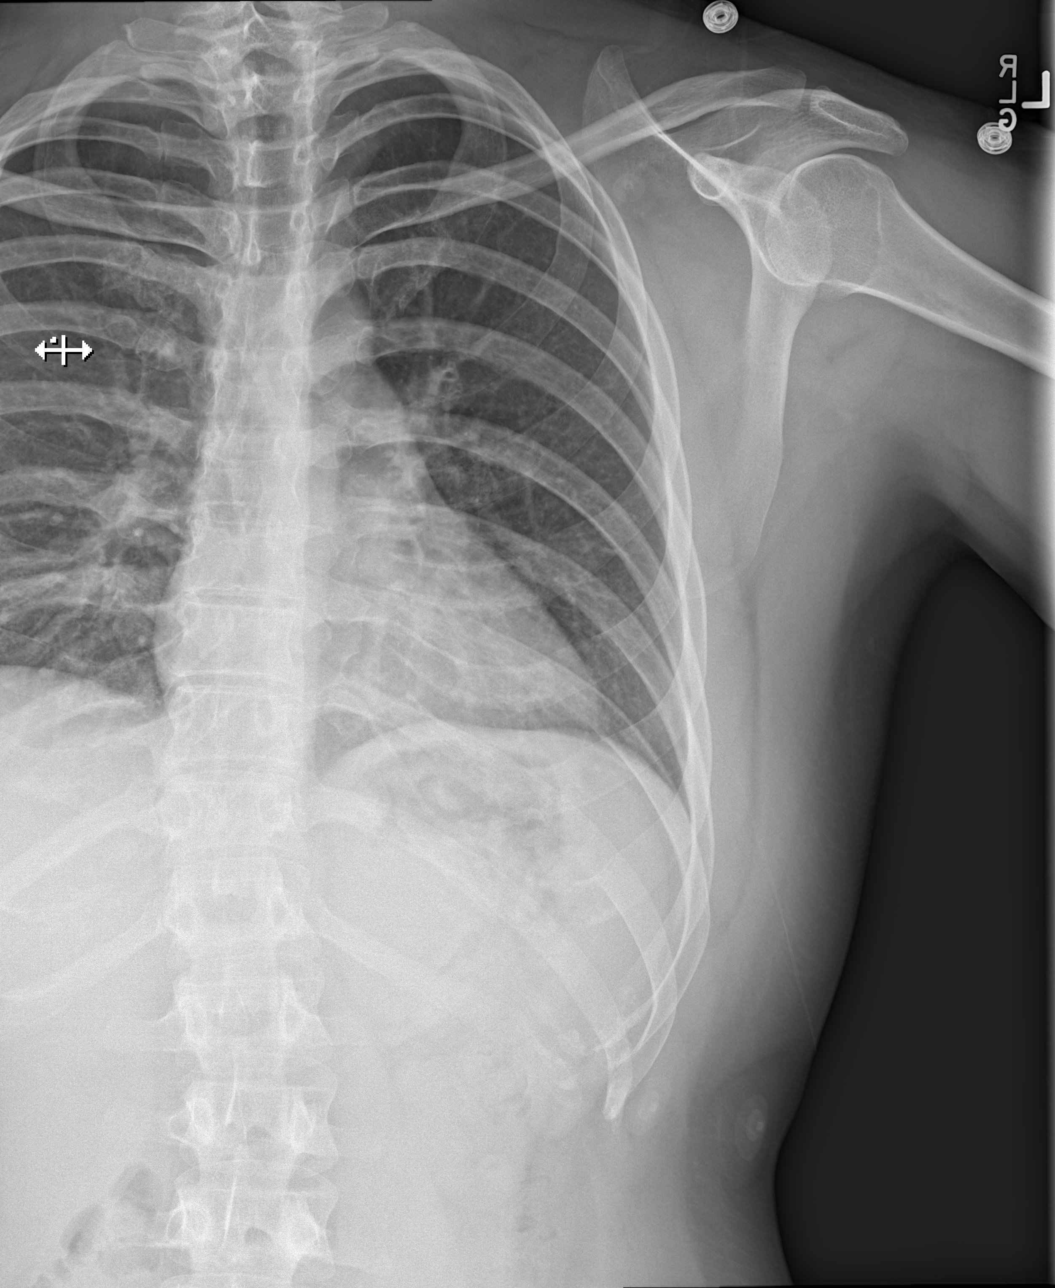

[w ribs obl left (2 of 3)]
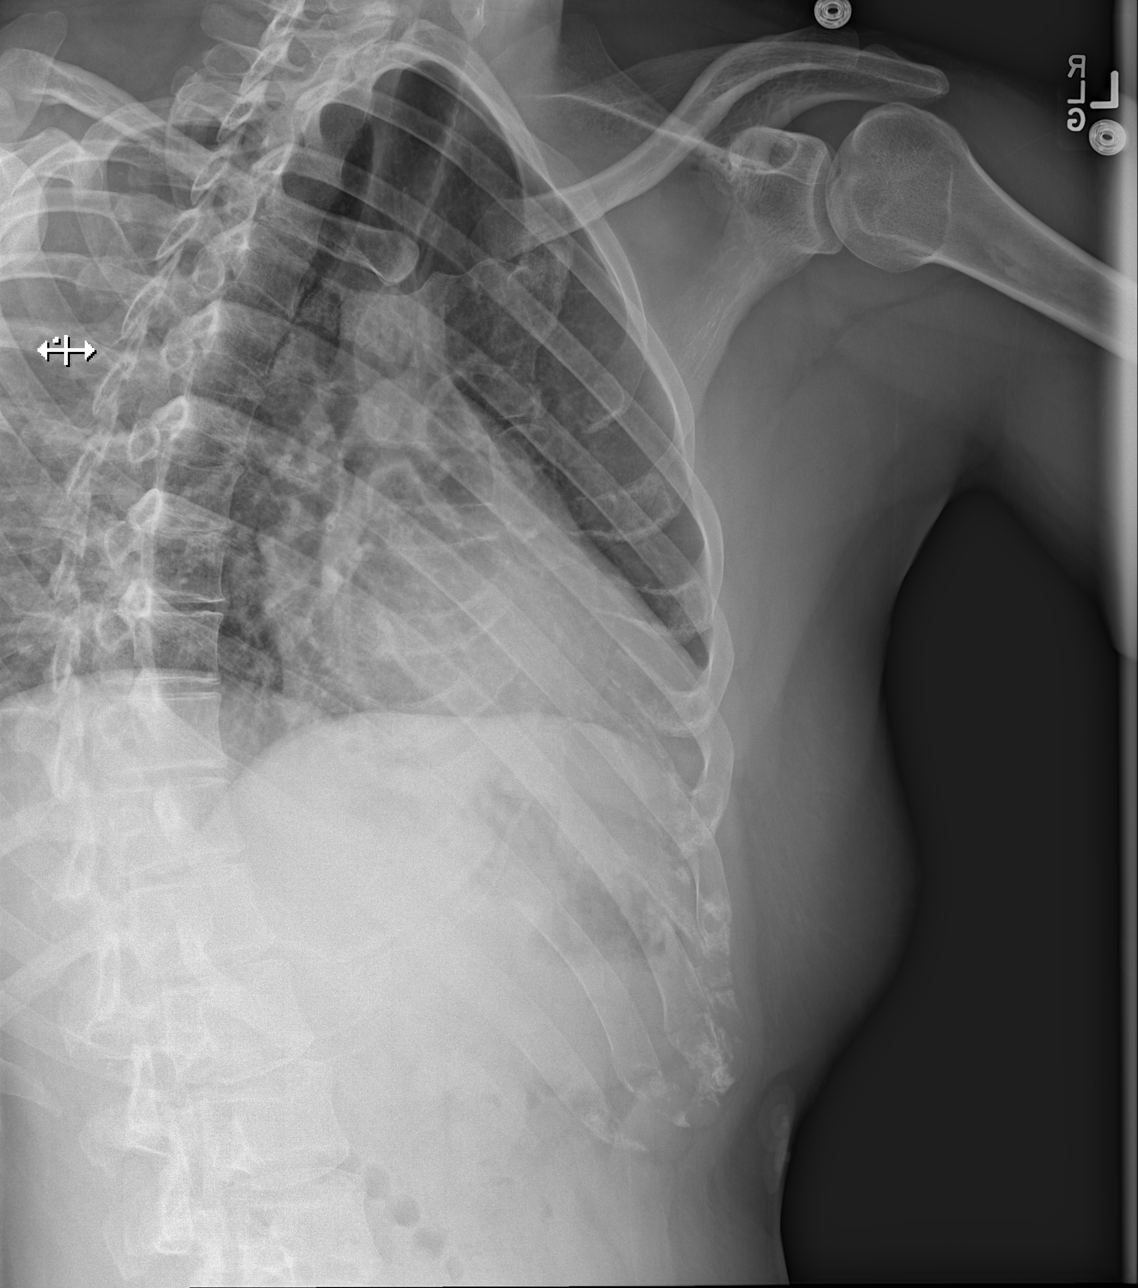

[w ribs obl left (3 of 3)]
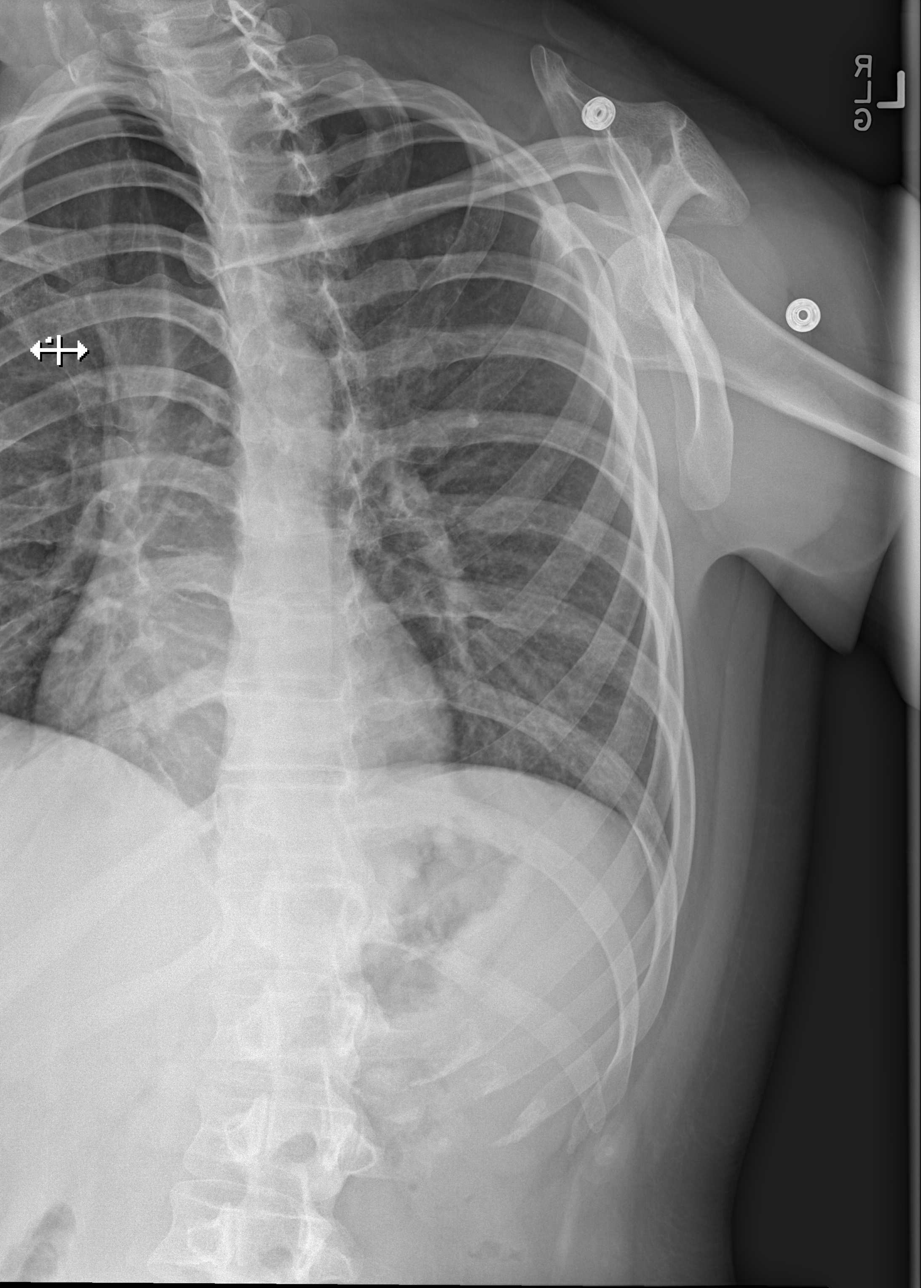

[4 of 4 positions shown; findings below may reference images not displayed]

FINDINGS: Normal sized heart. Clear lungs. The lungs remain mildly
hyperexpanded with mild diffuse peribronchial thickening and
accentuation of the interstitial markings. Normal appearing left
ribs. Mild scoliosis.
IMPRESSION: No acute abnormality. Stable mild changes of COPD and chronic
bronchitis.

## 2014-12-05 ENCOUNTER — Encounter (HOSPITAL_BASED_OUTPATIENT_CLINIC_OR_DEPARTMENT_OTHER): Payer: Self-pay

## 2014-12-05 ENCOUNTER — Emergency Department (HOSPITAL_BASED_OUTPATIENT_CLINIC_OR_DEPARTMENT_OTHER): Payer: Self-pay

## 2014-12-05 ENCOUNTER — Emergency Department (HOSPITAL_BASED_OUTPATIENT_CLINIC_OR_DEPARTMENT_OTHER)
Admission: EM | Admit: 2014-12-05 | Discharge: 2014-12-05 | Disposition: A | Discharge status: Home / Self Care | Payer: Self-pay | Attending: Emergency Medicine | Admitting: Emergency Medicine

## 2014-12-05 DIAGNOSIS — W109XXA Fall (on) (from) unspecified stairs and steps, initial encounter: Secondary | ICD-10-CM | POA: Insufficient documentation

## 2014-12-05 DIAGNOSIS — M546 Pain in thoracic spine: Secondary | ICD-10-CM

## 2014-12-05 DIAGNOSIS — T148XXA Other injury of unspecified body region, initial encounter: Secondary | ICD-10-CM

## 2014-12-05 DIAGNOSIS — J45909 Unspecified asthma, uncomplicated: Secondary | ICD-10-CM | POA: Insufficient documentation

## 2014-12-05 DIAGNOSIS — Z8659 Personal history of other mental and behavioral disorders: Secondary | ICD-10-CM | POA: Insufficient documentation

## 2014-12-05 DIAGNOSIS — Z720 Tobacco use: Secondary | ICD-10-CM | POA: Insufficient documentation

## 2014-12-05 DIAGNOSIS — Y998 Other external cause status: Secondary | ICD-10-CM | POA: Insufficient documentation

## 2014-12-05 DIAGNOSIS — Y9289 Other specified places as the place of occurrence of the external cause: Secondary | ICD-10-CM | POA: Insufficient documentation

## 2014-12-05 DIAGNOSIS — Y9389 Activity, other specified: Secondary | ICD-10-CM | POA: Insufficient documentation

## 2014-12-05 DIAGNOSIS — S20229A Contusion of unspecified back wall of thorax, initial encounter: Secondary | ICD-10-CM | POA: Insufficient documentation

## 2014-12-05 MED ORDER — LIDOCAINE 5 % EX PTCH: 1.0000 | MEDICATED_PATCH | CUTANEOUS | Status: DC

## 2014-12-05 MED ORDER — NAPROXEN 500 MG PO TABS: 500.0000 mg | ORAL_TABLET | Freq: Two times a day (BID) | ORAL | Status: DC

## 2014-12-05 MED ORDER — OXYCODONE-ACETAMINOPHEN 5-325 MG PO TABS
1.0000 | ORAL_TABLET | Freq: Once | ORAL | Status: AC
  Administered 2014-12-05: 1 via ORAL
  Filled 2014-12-05: qty 1

## 2014-12-05 MED ORDER — ONDANSETRON 4 MG PO TBDP
4.0000 mg | ORAL_TABLET | Freq: Once | ORAL | Status: AC
  Administered 2014-12-05: 4 mg via ORAL
  Filled 2014-12-05: qty 1

## 2014-12-05 MED ORDER — OXYCODONE-ACETAMINOPHEN 5-325 MG PO TABS: 1.0000 | ORAL_TABLET | Freq: Four times a day (QID) | ORAL | Status: DC | PRN

## 2014-12-05 NOTE — ED Notes (Signed)
Patient transported to X-ray 

## 2014-12-05 NOTE — Discharge Instructions (Signed)
You appear to have a contusion. This is a bone bruise. Apply ice and take the medications for pan relief. Your xray was negative.    SEEK IMMEDIATE MEDICAL ATTENTION IF: New numbness, tingling, weakness, or problem with the use of your arms or legs.  Severe back pain not relieved with medications.  Change in bowel or bladder control.  Increasing pain in any areas of the body (such as chest or abdominal pain).  Shortness of breath, dizziness or fainting.  Nausea (feeling sick to your stomach), vomiting, fever, or sweats.  Contusion A contusion is a deep bruise. Contusions are the result of an injury that caused bleeding under the skin. The contusion may turn blue, purple, or yellow. Minor injuries will give you a painless contusion, but more severe contusions may stay painful and swollen for a few weeks.  CAUSES  A contusion is usually caused by a blow, trauma, or direct force to an area of the body. SYMPTOMS   Swelling and redness of the injured area.  Bruising of the injured area.  Tenderness and soreness of the injured area.  Pain. DIAGNOSIS  The diagnosis can be made by taking a history and physical exam. An X-ray, CT scan, or MRI may be needed to determine if there were any associated injuries, such as fractures. TREATMENT  Specific treatment will depend on what area of the body was injured. In general, the best treatment for a contusion is resting, icing, elevating, and applying cold compresses to the injured area. Over-the-counter medicines may also be recommended for pain control. Ask your caregiver what the best treatment is for your contusion. HOME CARE INSTRUCTIONS   Put ice on the injured area.  Put ice in a plastic bag.  Place a towel between your skin and the bag.  Leave the ice on for 15-20 minutes, 3-4 times a day, or as directed by your health care provider.  Only take over-the-counter or prescription medicines for pain, discomfort, or fever as directed by  your caregiver. Your caregiver may recommend avoiding anti-inflammatory medicines (aspirin, ibuprofen, and naproxen) for 48 hours because these medicines may increase bruising.  Rest the injured area.  If possible, elevate the injured area to reduce swelling. SEEK IMMEDIATE MEDICAL CARE IF:   You have increased bruising or swelling.  You have pain that is getting worse.  Your swelling or pain is not relieved with medicines. MAKE SURE YOU:   Understand these instructions.  Will watch your condition.  Will get help right away if you are not doing well or get worse. Document Released: 01/21/2005 Document Revised: 04/18/2013 Document Reviewed: 02/16/2011 [facility] Patient Information 2015 [place], [place]. This information is not intended to replace advice given to you by your health care provider. Make sure you discuss any questions you have with your health care provider.

## 2014-12-05 NOTE — ED Provider Notes (Signed)
CSN: [id number]     Arrival date & time 12/05/14  1402 History   First MD Initiated Contact with Patient 12/05/14 1427     Chief Complaint  Patient presents with  . Fall     (Consider location/radiation/quality/duration/timing/severity/associated sxs/prior Treatment) HPI  Jose Carr is a(n) 24 y.o. male who presents to the ED c/o midline thoracic bp after fall down 2 stairs yesterday. Deep breaths and movement make it worse. Patient took Motrin and Tylenol without relief of his symptoms. He denies any bruising or bleeding. He is able to walk normally. He denies weakness in his legs, urinary or bowel incontinence, saddle anesthesia, recent procedures to his back. Past Medical History  Diagnosis Date  . Asthma   . ADHD (attention deficit hyperactivity disorder)    Past Surgical History  Procedure Laterality Date  . Knee surgery    . Hand surgery    . Carpal tunnel release     No family history on file. Social History  Substance Use Topics  . Smoking status: Current Every Day Smoker    Types: Cigarettes  . Smokeless tobacco: None  . Alcohol Use: No    Review of Systems  Ten systems reviewed and are negative for acute change, except as noted in the HPI.    Allergies  Review of patient's allergies indicates no known allergies.  Home Medications   Prior to Admission medications   Not on File   BP 131/83 mmHg  Pulse 73  Temp(Src) 98 F (36.7 C) (Oral)  Resp 18  Ht 5\' 7"  (1.702 m)  Wt 236 lb (107.049 kg)  BMI 36.95 kg/m2  SpO2 98% Physical Exam  Constitutional: He appears well-developed and well-nourished. No distress.  HENT:  Head: Normocephalic and atraumatic.  Eyes: Conjunctivae are normal. No scleral icterus.  Neck: Normal range of motion. Neck supple.  Cardiovascular: Normal rate, regular rhythm and normal heart sounds.   Pulmonary/Chest: Effort normal and breath sounds normal. No respiratory distress.  Abdominal: Soft. There is no tenderness.    Musculoskeletal: He exhibits no edema.       Thoracic back: He exhibits decreased range of motion, tenderness, bony tenderness and pain. He exhibits no swelling, no edema, no deformity and no spasm.       Back:  Neurological: He is alert.  Skin: Skin is warm and dry. He is not diaphoretic.  Psychiatric: His behavior is normal.  Nursing note and vitals reviewed.   ED Course  Procedures (including critical care time) Labs Review Labs Reviewed - No data to display  Imaging Review Dg Thoracic Spine W/swimmers  12/05/2014   CLINICAL DATA:  Larey Seat down steps last night, upper back pain  EXAM: THORACIC SPINE - 3 VIEWS  COMPARISON:  08/26/2013  FINDINGS: Three views of thoracic spine submitted. No acute fracture or subluxation. Alignment and vertebral body heights are preserved.  IMPRESSION: Negative.   Electronically Signed   By: Natasha Mead M.D.   On: 12/05/2014 15:06     EKG Interpretation None      MDM   Final diagnoses:  Midline thoracic back pain    4:08 PM BP 131/83 mmHg  Pulse 73  Temp(Src) 98 F (36.7 C) (Oral)  Resp 18  Ht 5\' 7"  (1.702 m)  Wt 236 lb (107.049 kg)  BMI 36.95 kg/m2  SpO2 98% Negative for acute abnormality on x-ray. Patient is able to move easily. Patient will be discharged with pain medication. For this represents contusion to the back. He appears  safe for discharge at this time. Patient with back pain.  No neurological deficits and normal neuro exam.  Patient can walk but states is painful.  No loss of bowel or bladder control.  No concern for cauda equina.  No fever, night sweats, weight loss, h/o cancer, IVDU.  RICE protocol and pain medicine indicated and discussed with patient.      Arthor Captain, PA-C 12/05/14 1609  Zadie Rhine, MD 12/07/14 1100

## 2014-12-05 NOTE — ED Notes (Signed)
Fell on porch last night-pain to mid back and flank pain

## 2015-03-31 IMAGING — CR DG ANKLE COMPLETE 3+V*R*
3 series · 3 of 3 positions shown · non-contrast
Comparison: None.

CLINICAL DATA: Patient complains of right ankle pain after "hearing
his ankle pop" while playing with puppy dog outside X 1 day.

EXAM:
RIGHT ANKLE - COMPLETE 3+ VIEW

[x ankle ap right]
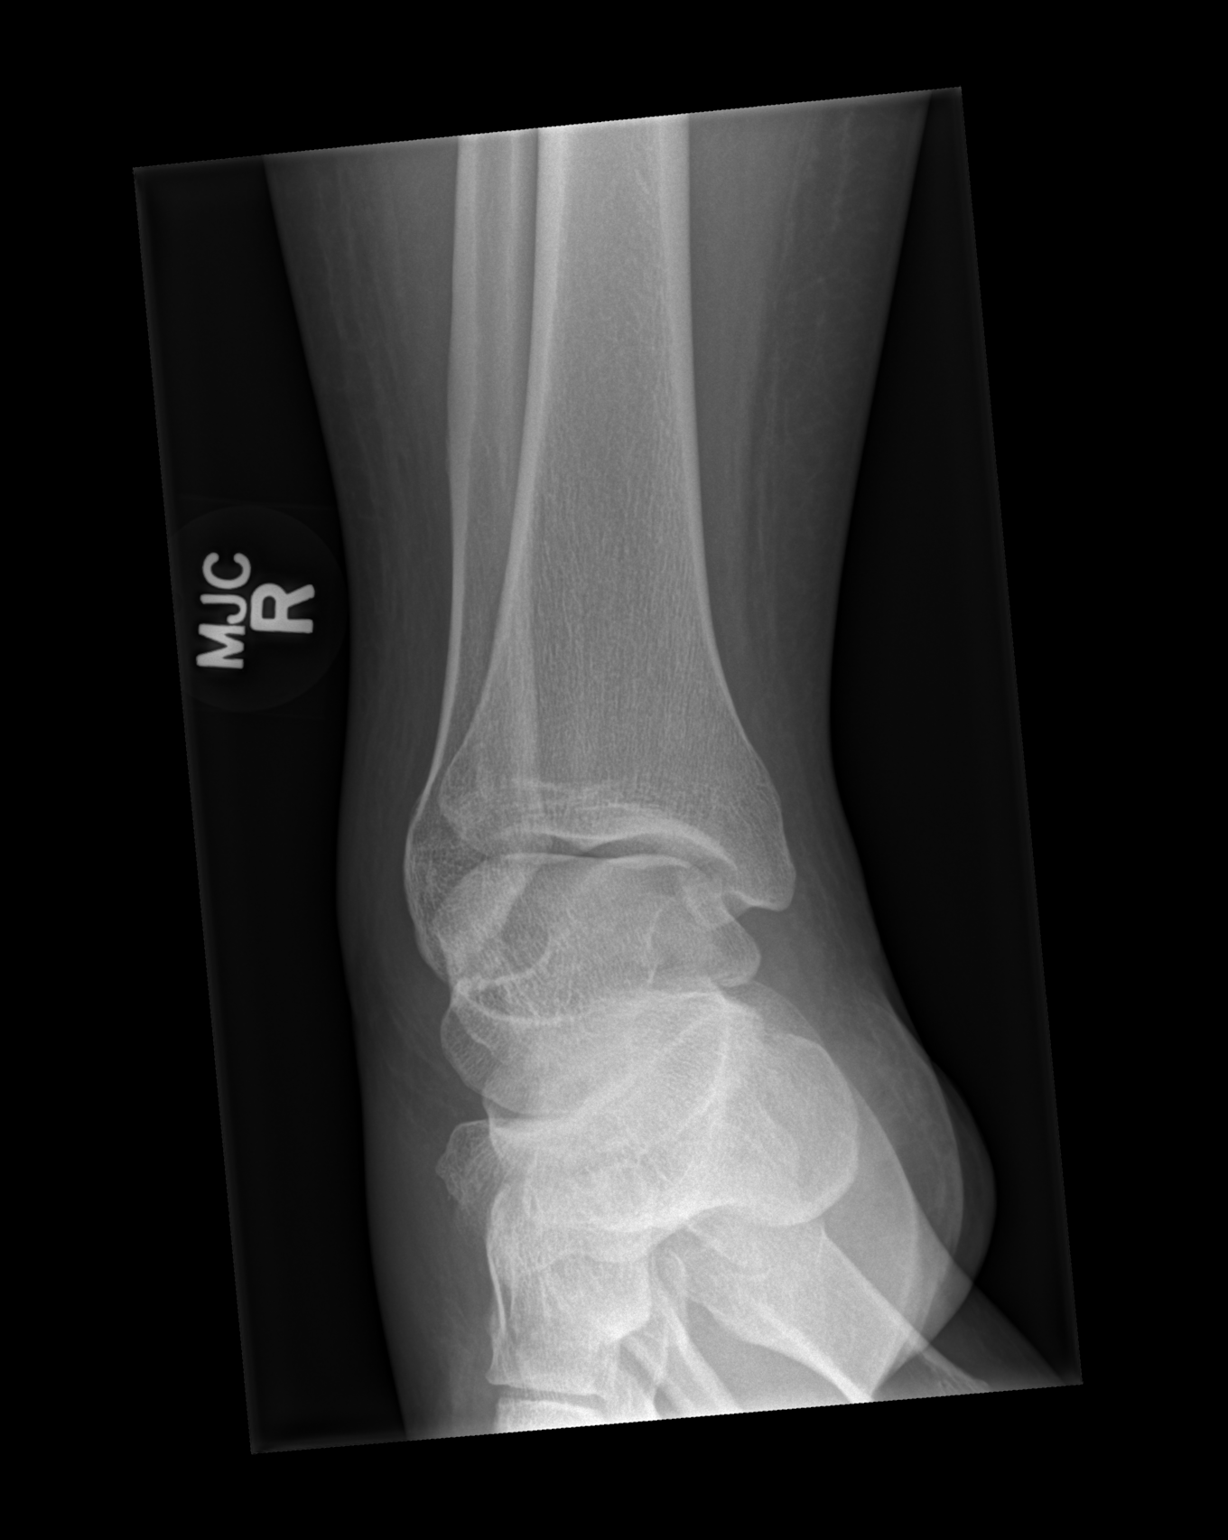

[x ankle obl right]
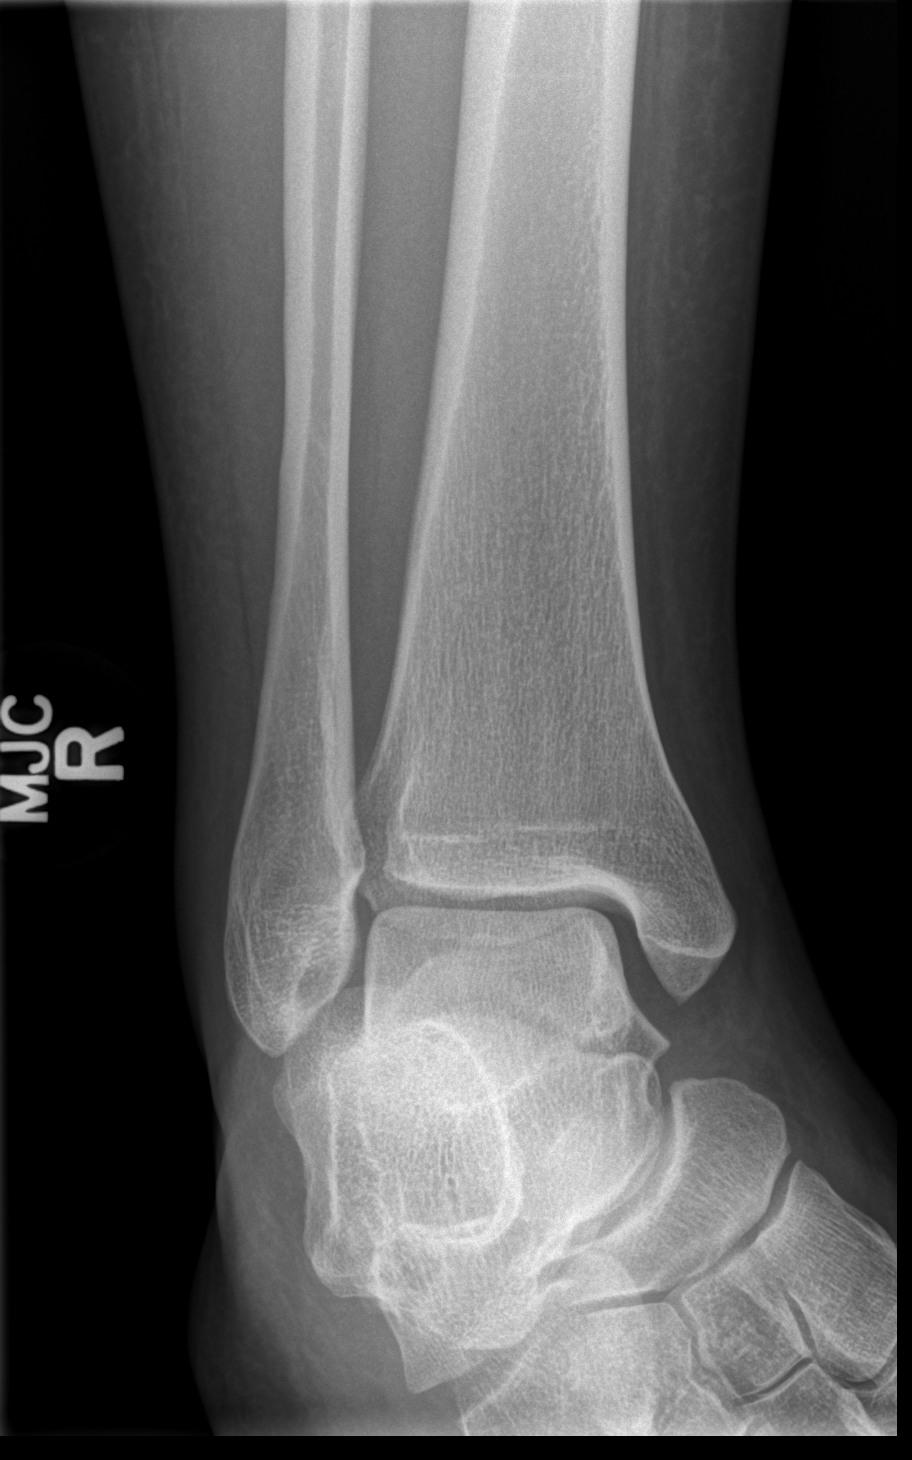

[x ankle lat right]
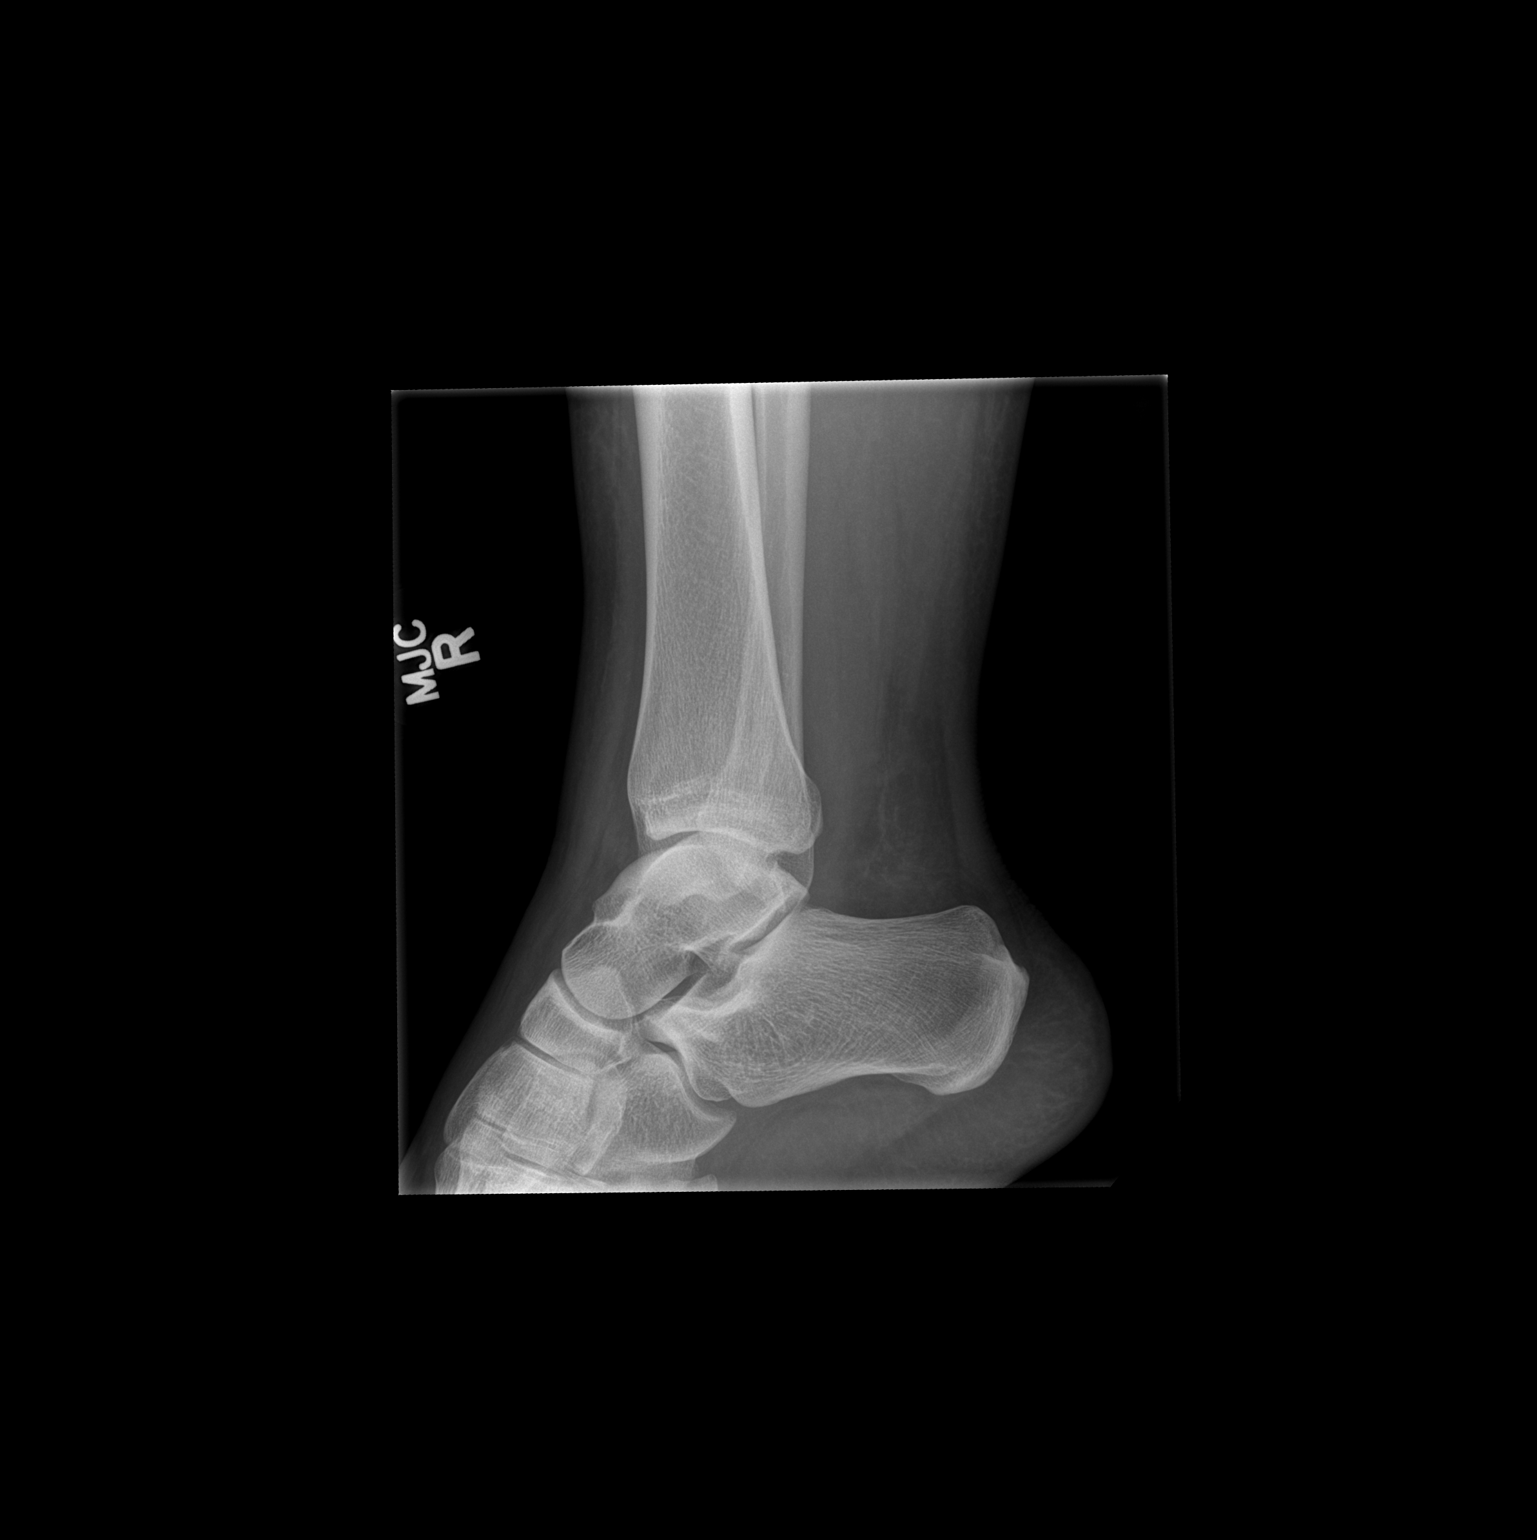

[3 of 3 positions shown; findings below may reference images not displayed]

FINDINGS: No fracture. Ankle mortise is normally space and aligned. No
arthropathic change

Mild diffuse nonspecific soft tissue edema.
IMPRESSION: No fracture or ankle joint abnormality.

## 2015-06-14 ENCOUNTER — Emergency Department (HOSPITAL_BASED_OUTPATIENT_CLINIC_OR_DEPARTMENT_OTHER): Payer: Self-pay

## 2015-06-14 ENCOUNTER — Emergency Department (HOSPITAL_BASED_OUTPATIENT_CLINIC_OR_DEPARTMENT_OTHER)
Admission: EM | Admit: 2015-06-14 | Discharge: 2015-06-15 | Disposition: A | Discharge status: Home / Self Care | Payer: Self-pay | Attending: Emergency Medicine | Admitting: Emergency Medicine

## 2015-06-14 ENCOUNTER — Encounter (HOSPITAL_BASED_OUTPATIENT_CLINIC_OR_DEPARTMENT_OTHER): Payer: Self-pay | Admitting: *Deleted

## 2015-06-14 DIAGNOSIS — Z791 Long term (current) use of non-steroidal anti-inflammatories (NSAID): Secondary | ICD-10-CM | POA: Insufficient documentation

## 2015-06-14 DIAGNOSIS — J441 Chronic obstructive pulmonary disease with (acute) exacerbation: Secondary | ICD-10-CM | POA: Insufficient documentation

## 2015-06-14 DIAGNOSIS — F17210 Nicotine dependence, cigarettes, uncomplicated: Secondary | ICD-10-CM | POA: Insufficient documentation

## 2015-06-14 DIAGNOSIS — Z8659 Personal history of other mental and behavioral disorders: Secondary | ICD-10-CM | POA: Insufficient documentation

## 2015-06-14 HISTORY — DX: Chronic obstructive pulmonary disease, unspecified: J44.9

## 2015-06-14 MED ORDER — IPRATROPIUM BROMIDE 0.02 % IN SOLN
1.0000 mg | Freq: Once | RESPIRATORY_TRACT | Status: AC
  Administered 2015-06-14: 1 mg via RESPIRATORY_TRACT
  Filled 2015-06-14: qty 5

## 2015-06-14 MED ORDER — ALBUTEROL (5 MG/ML) CONTINUOUS INHALATION SOLN
10.0000 mg/h | INHALATION_SOLUTION | RESPIRATORY_TRACT | Status: DC
  Administered 2015-06-14: 10 mg/h via RESPIRATORY_TRACT
  Filled 2015-06-14 (×2): qty 20

## 2015-06-14 MED ORDER — PREDNISONE 50 MG PO TABS
60.0000 mg | ORAL_TABLET | Freq: Once | ORAL | Status: AC
  Administered 2015-06-14: 60 mg via ORAL
  Filled 2015-06-14: qty 1

## 2015-06-14 NOTE — ED Notes (Signed)
Patient is alert and oriented x4.  He is complaining of chest pain with shortness of breath that he has had before. Patient states that he feels different today.  The pain is radiating to his back which is new.  Patient states that he  Has a Hx of COPD.  Currently he rates his pain 8 of 10.

## 2015-06-14 NOTE — ED Provider Notes (Signed)
CSN: [id number]     Arrival date & time 06/14/15  2012 History  By signing my name below, I, Jose Carr, attest that this documentation has been prepared under the direction and in the presence of Tomasita Crumble, MD . Electronically Signed: Freida Carr, Scribe. 06/14/2015. 11:31 PM.    Chief Complaint  Patient presents with  . Chest Pain    The history is provided by the patient. No language interpreter was used.     HPI Comments:  Jose Carr is a 25 y.o. male with a history of asthma and COPD, who presents to the Emergency Department complaining of constant moderate CP that radiates around to his mid back x 5 days. He reports h/o similar pain secondary to his COPD but notes his pain today is worse. His pain is exacerbated with deep inspiration. He has taken aleve ibuprofen and percocet without relief. Pt is a current everyday smoker. He denies swelling in his BLE. He also denies recent long periods of immobilization, recent surgery, and h/o blood clots.    Past Medical History  Diagnosis Date  . Asthma   . ADHD (attention deficit hyperactivity disorder)   . COPD (chronic obstructive pulmonary disease) [facility])    Past Surgical History  Procedure Laterality Date  . Knee surgery    . Hand surgery    . Carpal tunnel release    . Hand surgery    . Knee surgery     History reviewed. No pertinent family history. Social History  Substance Use Topics  . Smoking status: Current Every Day Smoker    Types: Cigarettes  . Smokeless tobacco: None  . Alcohol Use: No    Review of Systems  10 systems reviewed and all are negative for acute change except as noted in the HPI.   Allergies  Review of patient's allergies indicates no known allergies.  Home Medications   Prior to Admission medications   Medication Sig Start Date End Date Taking? Authorizing Provider  lidocaine (LIDODERM) 5 % Place 1 patch onto the skin daily. Remove & Discard patch within 12 hours or as directed by MD  12/05/14   Arthor Captain, PA-C  naproxen (NAPROSYN) 500 MG tablet Take 1 tablet (500 mg total) by mouth 2 (two) times daily with a meal. 12/05/14   Arthor Captain, PA-C  oxyCODONE-acetaminophen (PERCOCET) 5-325 MG per tablet Take 1-2 tablets by mouth every 6 (six) hours as needed. 12/05/14   Abigail Harris, PA-C   BP 116/76 mmHg  Pulse 62  Temp(Src) 98.6 F (37 C) (Oral)  Resp 18  SpO2 99% Physical Exam  Constitutional: He is oriented to person, place, and time. Vital signs are normal. He appears well-developed and well-nourished.  Non-toxic appearance. He does not appear ill. No distress.  HENT:  Head: Normocephalic and atraumatic.  Nose: Nose normal.  Mouth/Throat: Oropharynx is clear and moist. No oropharyngeal exudate.  Eyes: Conjunctivae and EOM are normal. Pupils are equal, round, and reactive to light. No scleral icterus.  Neck: Normal range of motion. Neck supple. No tracheal deviation, no edema, no erythema and normal range of motion present. No thyroid mass and no thyromegaly present.  Cardiovascular: Normal rate, regular rhythm, S1 normal, S2 normal, normal heart sounds, intact distal pulses and normal pulses.  Exam reveals no gallop and no friction rub.   No murmur heard. Pulmonary/Chest: Effort normal. No respiratory distress. He has wheezes. He has no rhonchi. He has rales.  Abdominal: Soft. Normal appearance and bowel sounds are normal.  He exhibits no distension, no ascites and no mass. There is no hepatosplenomegaly. There is no tenderness. There is no rebound, no guarding and no CVA tenderness.  Musculoskeletal: Normal range of motion. He exhibits no edema or tenderness.  Lymphadenopathy:    He has no cervical adenopathy.  Neurological: He is alert and oriented to person, place, and time. He has normal strength. No cranial nerve deficit or sensory deficit.  Skin: Skin is warm, dry and intact. No petechiae and no rash noted. He is not diaphoretic. No erythema. No pallor.   Psychiatric: He has a normal mood and affect. His behavior is normal. Judgment normal.  Nursing note and vitals reviewed.   ED Course  Procedures   DIAGNOSTIC STUDIES:  Oxygen Saturation is 97% on RA, normal by my interpretation.    COORDINATION OF CARE:  11:30 PM The patient was counseled on the dangers of tobacco abuse and advised to quit smoking. I spent 5 minutes in counseling of smoking cessation strategies.  11:32 PM Will order CXR,  breathing treatment and steroids. Discussed treatment plan with pt at bedside and pt agreed to plan.  Labs Review Labs Reviewed - No data to display  Imaging Review Dg Chest 2 View  06/15/2015  CLINICAL DATA:  Chest pain and wheezing EXAM: CHEST  2 VIEW COMPARISON:  08/26/2013 chest radiograph chest radiograph. FINDINGS: Stable cardiomediastinal silhouette with normal heart size. No pneumothorax. No pleural effusion. Lungs appear clear, with no acute consolidative airspace disease and no pulmonary edema. IMPRESSION: No active cardiopulmonary disease. Electronically Signed   By: Delbert Phenix M.D.   On: 06/15/2015 00:01   I have personally reviewed and evaluated these images and lab results as part of my medical decision-making.    EKG Interpretation Date/Time:  Friday June 14 2015 20:22:26 EST Ventricular Rate:  72 PR Interval:  136 QRS Duration: 96 QT Interval:  386 QTC Calculation: 422 R Axis:   91 Text Interpretation:  Normal sinus rhythm with sinus arrhythmia Rightward  axis Borderline ECG Confirmed by Erroll Luna [phone]) on  06/14/2015 10:57:48 PM      MDM   Final diagnoses:  None   Patient since emergency department for chest pain and shortness of breath. Exam reveals wheezing, this likely his COPD. He was given breathing treatment, and prednisone. Chest x-ray is negative for pneumonia.   Upon repeat evaluation, patient states his chest pain has resolved and his breathing is better. We'll discharge home with 4  more days of prednisone. He demonstrates good understanding. EKG does not show any signs of ischemia. He appears well-nourished distress, vital signs were within his normal limits and he is safe for discharge.    I personally performed the services described in this documentation, which was scribed in my presence. The recorded information has been reviewed and is accurate.     Tomasita Crumble, MD 06/15/15 0040

## 2015-06-15 MED ORDER — PREDNISONE 20 MG PO TABS: 60.0000 mg | ORAL_TABLET | Freq: Every day | ORAL | Status: DC

## 2015-06-15 NOTE — Progress Notes (Signed)
Continuous neb stopped due to pt request. BBS clear with good aeration. HR 63, RR 16. Pt states his fingers, toes and lips are tingling. MD notified.

## 2015-06-15 NOTE — Discharge Instructions (Signed)
Chronic Obstructive Pulmonary Disease Exacerbation Jose Carr, take steroids for the next 4 days to complete your treatment.  See a primary care doctor within 3 days for close follow up.  STOP SMOKING!   If any symptoms worsen, come back to the ED immediately. Thank you. Chronic obstructive pulmonary disease (COPD) is a common lung problem. In COPD, the flow of air from the lungs is limited. COPD exacerbations are times that breathing gets worse and you need extra treatment. Without treatment they can be life threatening. If they happen often, your lungs can become more damaged. If your COPD gets worse, your doctor may treat you with:  Medicines.  Oxygen.  Different ways to clear your airway, such as using a mask. HOME CARE  Do not smoke.  Avoid tobacco smoke and other things that bother your lungs.  If given, take your antibiotic medicine as told. Finish the medicine even if you start to feel better.  Only take medicines as told by your doctor.  Drink enough fluids to keep your pee (urine) clear or pale yellow (unless your doctor has told you not to).  Use a cool mist machine (vaporizer).  If you use oxygen or a machine that turns liquid medicine into a mist (nebulizer), continue to use them as told.  Keep up with shots (vaccinations) as told by your doctor.  Exercise regularly.  Eat healthy foods.  Keep all doctor visits as told. GET HELP RIGHT AWAY IF:  You are very short of breath and it gets worse.  You have trouble talking.  You have bad chest pain.  You have blood in your spit (sputum).  You have a fever.  You keep throwing up (vomiting).  You feel weak, or you pass out (faint).  You feel confused.  You keep getting worse. MAKE SURE YOU:  Understand these instructions.  Will watch your condition.  Will get help right away if you are not doing well or get worse.   This information is not intended to replace advice given to you by your health care  provider. Make sure you discuss any questions you have with your health care provider.   Document Released: 04/02/2011 Document Revised: 05/04/2014 Document Reviewed: 12/16/2012 Elsevier Interactive Patient Education [employer]

## 2015-09-28 ENCOUNTER — Emergency Department (HOSPITAL_COMMUNITY)
Admission: EM | Admit: 2015-09-28 | Discharge: 2015-09-28 | Disposition: A | Discharge status: Home / Self Care | Payer: Self-pay | Attending: Emergency Medicine | Admitting: Emergency Medicine

## 2015-09-28 ENCOUNTER — Encounter (HOSPITAL_COMMUNITY): Payer: Self-pay

## 2015-09-28 ENCOUNTER — Emergency Department (HOSPITAL_COMMUNITY): Payer: Self-pay

## 2015-09-28 DIAGNOSIS — J441 Chronic obstructive pulmonary disease with (acute) exacerbation: Secondary | ICD-10-CM | POA: Insufficient documentation

## 2015-09-28 DIAGNOSIS — F17210 Nicotine dependence, cigarettes, uncomplicated: Secondary | ICD-10-CM | POA: Insufficient documentation

## 2015-09-28 DIAGNOSIS — Z8659 Personal history of other mental and behavioral disorders: Secondary | ICD-10-CM | POA: Insufficient documentation

## 2015-09-28 DIAGNOSIS — Z7952 Long term (current) use of systemic steroids: Secondary | ICD-10-CM | POA: Insufficient documentation

## 2015-09-28 DIAGNOSIS — J069 Acute upper respiratory infection, unspecified: Secondary | ICD-10-CM | POA: Insufficient documentation

## 2015-09-28 DIAGNOSIS — R05 Cough: Secondary | ICD-10-CM

## 2015-09-28 DIAGNOSIS — R059 Cough, unspecified: Secondary | ICD-10-CM

## 2015-09-28 MED ORDER — ALBUTEROL SULFATE HFA 108 (90 BASE) MCG/ACT IN AERS: 1.0000 | INHALATION_SPRAY | Freq: Four times a day (QID) | RESPIRATORY_TRACT | Status: AC | PRN

## 2015-09-28 MED ORDER — PREDNISONE 20 MG PO TABS: 60.0000 mg | ORAL_TABLET | Freq: Every day | ORAL | Status: DC

## 2015-09-28 MED ORDER — BENZONATATE 100 MG PO CAPS: 100.0000 mg | ORAL_CAPSULE | Freq: Three times a day (TID) | ORAL | Status: DC

## 2015-09-28 NOTE — ED Notes (Signed)
Water given as requested

## 2015-09-28 NOTE — ED Notes (Signed)
Patient here with increased cough the past 1 day. Continuing to smoke and wants prednisone before his COPD becomes worse

## 2015-09-28 NOTE — ED Provider Notes (Signed)
CSN: [id number]     Arrival date & time 09/28/15  1903 History  By signing my name below, I, Linna DarnerRussell Turner, attest that this documentation has been prepared under the direction and in the presence of Sealed Air CorporationHeather Shloima Clinch, PA-C. Electronically Signed: Linna Darnerussell Turner, Scribe. 09/28/2015. 7:52 PM.     Chief Complaint  Patient presents with  . cough     The history is provided by the patient. No language interpreter was used.     HPI Comments: Sharol RousselCory Mellin is a 25 y.o. male with h/o COPD and asthma who presents to the Emergency Department complaining of sudden onset, constant, worsening, middle chest and bilateral ribcage pain beginning yesterday. Pt also notes a dry cough for the last two days. Pt reports that coughing, laughing, and raising his voice exacerbate his chest/ribcage pain. Pt endorses associated SOB and wheezing and reports that he has used an inhaler with relief; he states that he wheezes regularly. Pt additionally notes left-sided neck pain/soreness; he has used a hot and cold pad with temporary relief. He states that his current symptoms feel like a COPD flare up. He was seen in the ER in February of this year for similar symptoms and was prescribed prednisone which provided moderate relief. Pt states that he smokes 0.5 ppd and has cut down from 1 ppd. He denies fever, congestion, or any other associated symptoms.  Past Medical History  Diagnosis Date  . Asthma   . ADHD (attention deficit hyperactivity disorder)   . COPD (chronic obstructive pulmonary disease) [facility])    Past Surgical History  Procedure Laterality Date  . Knee surgery    . Hand surgery    . Carpal tunnel release    . Hand surgery    . Knee surgery     No family history on file. Social History  Substance Use Topics  . Smoking status: Current Every Day Smoker    Types: Cigarettes  . Smokeless tobacco: None  . Alcohol Use: No    Review of Systems  A complete 10 system review of systems was obtained and all  systems are negative except as noted in the HPI and PMH.   Allergies  Review of patient's allergies indicates no known allergies.  Home Medications   Prior to Admission medications   Medication Sig Start Date End Date Taking? Authorizing Provider  predniSONE (DELTASONE) 20 MG tablet Take 3 tablets (60 mg total) by mouth daily. 06/15/15   Tomasita CrumbleAdeleke Oni, MD   BP 106/69 mmHg  Pulse 62  Temp(Src) 97.7 F (36.5 C) (Oral)  Resp 20  SpO2 97% Physical Exam  Constitutional: He is oriented to person, place, and time. He appears well-developed and well-nourished. No distress.  HENT:  Head: Normocephalic and atraumatic.  Mouth/Throat: Oropharynx is clear and moist. No oropharyngeal exudate, posterior oropharyngeal edema or posterior oropharyngeal erythema.  Neck: Normal range of motion. Neck supple. No tracheal deviation present.  Cardiovascular: Normal rate, regular rhythm and normal heart sounds.   Pulmonary/Chest: Effort normal and breath sounds normal. No respiratory distress. He has no wheezes. He has no rhonchi.  Musculoskeletal: Normal range of motion.  Neurological: He is alert and oriented to person, place, and time.  Skin: Skin is warm and dry.  Psychiatric: He has a normal mood and affect. His behavior is normal.  Nursing note and vitals reviewed.   ED Course  Procedures (including critical care time)  DIAGNOSTIC STUDIES: Oxygen Saturation is 97% on RA, normal by my interpretation.    COORDINATION  OF CARE: 7:52 PM Discussed treatment plan with pt at bedside and pt agreed to plan.  Labs Review Labs Reviewed - No data to display  Imaging Review Dg Chest 2 View  09/28/2015  CLINICAL DATA:  Acute chest pain and cough for 2 days. EXAM: CHEST  2 VIEW COMPARISON:  06/14/2015 and prior radiographs FINDINGS: The cardiomediastinal silhouette is unremarkable. Mild peribronchial thickening is unchanged. There is no evidence of focal airspace disease, pulmonary edema, suspicious pulmonary  nodule/mass, pleural effusion, or pneumothorax. No acute bony abnormalities are identified. IMPRESSION: No evidence of acute cardiopulmonary disease. Mild chronic peribronchial thickening. Electronically Signed   By: Harmon Pier M.D.   On: 09/28/2015 20:40   I have personally reviewed and evaluated these images and lab results as part of my medical decision-making.   EKG Interpretation None      MDM   Final diagnoses:  None   Pt CXR negative for acute infiltrate. Patients symptoms are consistent with URI, likely viral etiology. Discussed that antibiotics are not indicated for viral infections. Pt will be discharged with symptomatic treatment.  Verbalizes understanding and is agreeable with plan. Pt is hemodynamically stable & in NAD prior to dc.  I personally performed the services described in this documentation, which was scribed in my presence. The recorded information has been reviewed and is accurate.   Santiago Glad, PA-C 09/28/15 1478  Marily Memos, MD 09/28/15 [phone]

## 2017-07-13 ENCOUNTER — Emergency Department (HOSPITAL_COMMUNITY)
Admission: EM | Admit: 2017-07-13 | Discharge: 2017-07-14 | Disposition: A | Discharge status: Home / Self Care | Payer: BLUE CROSS/BLUE SHIELD | Attending: Emergency Medicine | Admitting: Emergency Medicine

## 2017-07-13 ENCOUNTER — Encounter (HOSPITAL_COMMUNITY): Payer: Self-pay | Admitting: Emergency Medicine

## 2017-07-13 DIAGNOSIS — F909 Attention-deficit hyperactivity disorder, unspecified type: Secondary | ICD-10-CM | POA: Diagnosis not present

## 2017-07-13 DIAGNOSIS — J449 Chronic obstructive pulmonary disease, unspecified: Secondary | ICD-10-CM | POA: Diagnosis not present

## 2017-07-13 DIAGNOSIS — F17210 Nicotine dependence, cigarettes, uncomplicated: Secondary | ICD-10-CM | POA: Insufficient documentation

## 2017-07-13 DIAGNOSIS — R109 Unspecified abdominal pain: Secondary | ICD-10-CM

## 2017-07-13 DIAGNOSIS — R1032 Left lower quadrant pain: Secondary | ICD-10-CM | POA: Insufficient documentation

## 2017-07-13 DIAGNOSIS — R310 Gross hematuria: Secondary | ICD-10-CM

## 2017-07-13 DIAGNOSIS — R319 Hematuria, unspecified: Secondary | ICD-10-CM | POA: Diagnosis present

## 2017-07-13 LAB — URINALYSIS, ROUTINE W REFLEX MICROSCOPIC
Bacteria, UA: NONE SEEN
Bilirubin Urine: NEGATIVE
Glucose, UA: NEGATIVE mg/dL
Ketones, ur: NEGATIVE mg/dL
Leukocytes, UA: NEGATIVE
Nitrite: NEGATIVE
Protein, ur: NEGATIVE mg/dL
Specific Gravity, Urine: 1.023 (ref 1.005–1.030)
pH: 5 (ref 5.0–8.0)

## 2017-07-13 MED ORDER — HYDROCODONE-ACETAMINOPHEN 5-325 MG PO TABS: 1.0000 | ORAL_TABLET | Freq: Four times a day (QID) | ORAL | 0 refills | Status: DC | PRN

## 2017-07-13 NOTE — Discharge Instructions (Signed)
As we discussed you probably have a kidney stone. You can take ibuprofen 400 mg every 6 hours for the next 3 days. You Can use Vicodin for breakthrough pain If your pain is not controlled with these medicines, please return to the ER

## 2017-07-13 NOTE — ED Triage Notes (Signed)
Pt reports hematuria started today, dysuria, and polyuria. Denies N/V/D. No hx of kidney stones. Flank pain to L side.

## 2017-07-14 NOTE — ED Provider Notes (Signed)
[facility] EMERGENCY DEPARTMENT Provider Note   CSN: [id number] Arrival date & time: 07/13/17  2110     History   Chief Complaint Chief Complaint  Patient presents with  . Hematuria    HPI Jose Carr is a 27 y.o. male.  The history is provided by the patient.  Hematuria  This is a new problem. The current episode started 12 to 24 hours ago. The problem has not changed since onset.Pertinent negatives include no chest pain and no shortness of breath. Nothing aggravates the symptoms. Nothing relieves the symptoms.   With history of ADHD presents with flank pain and hematuria.  He reports it started the past 12-24 hrs.  He reports when he urinates, he sees gross blood and has mild burning.  He is also had some left flank pain.  Still has some mild lower abdominal..  No fever or vomiting.  No known history of kidney stones, but has had these symptoms previously. Past Medical History:  Diagnosis Date  . ADHD (attention deficit hyperactivity disorder)   . Asthma   . COPD (chronic obstructive pulmonary disease) (HCC)     There are no active problems to display for this patient.   Past Surgical History:  Procedure Laterality Date  . CARPAL TUNNEL RELEASE    . HAND SURGERY    . HAND SURGERY    . KNEE SURGERY    . KNEE SURGERY         Home Medications    Prior to Admission medications   Medication Sig Start Date End Date Taking? Authorizing Provider  albuterol (PROVENTIL HFA;VENTOLIN HFA) 108 (90 Base) MCG/ACT inhaler Inhale 1-2 puffs into the lungs every 6 (six) hours as needed for wheezing or shortness of breath. 09/28/15   Santiago Glad, PA-C  HYDROcodone-acetaminophen (NORCO/VICODIN) 5-325 MG tablet Take 1 tablet by mouth every 6 (six) hours as needed for severe pain. 07/13/17   Zadie Rhine, MD  ibuprofen (ADVIL,MOTRIN) 600 MG tablet Take 600 mg by mouth every 6 (six) hours as needed.    [provider]    Family History No family history on  file.  Social History Social History   Tobacco Use  . Smoking status: Current Every Day Smoker    Packs/day: 0.50    Types: Cigarettes  . Smokeless tobacco: Never Used  Substance Use Topics  . Alcohol use: Yes    Comment: occasionally  . Drug use: Yes    Types: Marijuana    Comment: 07/13/17     Allergies   Patient has no known allergies.   Review of Systems Review of Systems  Constitutional: Negative for fever.  Respiratory: Negative for shortness of breath.   Cardiovascular: Negative for chest pain.  Genitourinary: Positive for dysuria, flank pain and hematuria. Negative for scrotal swelling and testicular pain.  All other systems reviewed and are negative.    Physical Exam Updated Vital Signs BP 129/63 (BP Location: Right Arm)   Pulse 80   Temp 98.3 F (36.8 C) (Temporal)   Resp 16   SpO2 97%   Physical Exam CONSTITUTIONAL: Well developed/well nourished HEAD: Normocephalic/atraumatic EYES: EOMI/PERRL ENMT: Mucous membranes moist NECK: supple no meningeal signs SPINE/BACK:entire spine nontender CV: S1/S2 noted, no murmurs/rubs/gallops noted LUNGS: Lungs are clear to auscultation bilaterally, no apparent distress ABDOMEN: soft, nontender, no rebound or guarding, bowel sounds noted throughout abdomen GU: Left Cva tenderness NEURO: Pt is awake/alert/appropriate, moves all extremitiesx4.  No facial droop.   EXTREMITIES: pulses normal/equal, full ROM SKIN: warm,  color normal PSYCH: no abnormalities of mood noted, alert and oriented to situation   ED Treatments / Results  Labs (all labs ordered are listed, but only abnormal results are displayed) Labs Reviewed  URINALYSIS, ROUTINE W REFLEX MICROSCOPIC - Abnormal; Notable for the following components:      Result Value   Hgb urine dipstick LARGE (*)    Squamous Epithelial / LPF 0-5 (*)    All other components within normal limits  URINE CULTURE    EKG  EKG Interpretation None       Radiology No  results found.  Procedures Procedures (including critical care time)  Medications Ordered in ED Medications - No data to display   Initial Impression / Assessment and Plan / ED Course  I have reviewed the triage vital signs and the nursing notes.  Pertinent labs results that were available during my care of the patient were reviewed by me and considered in my medical decision making (see chart for details).     Narcotic database reviewed and considered in decision making  Presents with left flank pain and hematuria.  He is well-appearing.  No fevers.  No vomiting.  I offered further workup including imaging, but he declines.  He reports he is feeling improved. We discussed strict return precautions Final Clinical Impressions(s) / ED Diagnoses   Final diagnoses:  Flank pain  Gross hematuria    ED Discharge Orders        Ordered    HYDROcodone-acetaminophen (NORCO/VICODIN) 5-325 MG tablet  Every 6 hours PRN     07/13/17 2358       Zadie RhineWickline, Demetrius Barrell, MD 07/14/17 [phone]

## 2017-07-15 LAB — URINE CULTURE
Culture: NO GROWTH
Special Requests: NORMAL

## 2017-09-24 ENCOUNTER — Emergency Department (HOSPITAL_COMMUNITY): Payer: BLUE CROSS/BLUE SHIELD

## 2017-09-24 ENCOUNTER — Encounter (HOSPITAL_COMMUNITY): Payer: Self-pay | Admitting: *Deleted

## 2017-09-24 ENCOUNTER — Emergency Department (HOSPITAL_COMMUNITY)
Admission: EM | Admit: 2017-09-24 | Discharge: 2017-09-24 | Disposition: A | Discharge status: Home / Self Care | Payer: BLUE CROSS/BLUE SHIELD | Attending: Emergency Medicine | Admitting: Emergency Medicine

## 2017-09-24 ENCOUNTER — Encounter (HOSPITAL_COMMUNITY): Payer: Self-pay

## 2017-09-24 ENCOUNTER — Emergency Department (HOSPITAL_COMMUNITY)
Admission: EM | Admit: 2017-09-24 | Discharge: 2017-09-25 | Disposition: A | Discharge status: Home / Self Care | Payer: BLUE CROSS/BLUE SHIELD | Source: Home / Self Care | Attending: Emergency Medicine | Admitting: Emergency Medicine

## 2017-09-24 ENCOUNTER — Other Ambulatory Visit: Payer: Self-pay

## 2017-09-24 DIAGNOSIS — R1084 Generalized abdominal pain: Secondary | ICD-10-CM | POA: Insufficient documentation

## 2017-09-24 DIAGNOSIS — Z5321 Procedure and treatment not carried out due to patient leaving prior to being seen by health care provider: Secondary | ICD-10-CM | POA: Insufficient documentation

## 2017-09-24 DIAGNOSIS — R112 Nausea with vomiting, unspecified: Secondary | ICD-10-CM | POA: Diagnosis not present

## 2017-09-24 DIAGNOSIS — R1111 Vomiting without nausea: Secondary | ICD-10-CM

## 2017-09-24 DIAGNOSIS — J449 Chronic obstructive pulmonary disease, unspecified: Secondary | ICD-10-CM

## 2017-09-24 DIAGNOSIS — R1110 Vomiting, unspecified: Secondary | ICD-10-CM

## 2017-09-24 DIAGNOSIS — R1011 Right upper quadrant pain: Secondary | ICD-10-CM | POA: Insufficient documentation

## 2017-09-24 DIAGNOSIS — R109 Unspecified abdominal pain: Secondary | ICD-10-CM | POA: Diagnosis not present

## 2017-09-24 DIAGNOSIS — R1012 Left upper quadrant pain: Secondary | ICD-10-CM | POA: Diagnosis not present

## 2017-09-24 DIAGNOSIS — F17210 Nicotine dependence, cigarettes, uncomplicated: Secondary | ICD-10-CM

## 2017-09-24 LAB — CBC
HCT: 45.2 % (ref 39.0–52.0)
Hemoglobin: 15.6 g/dL (ref 13.0–17.0)
MCH: 30.2 pg (ref 26.0–34.0)
MCHC: 34.5 g/dL (ref 30.0–36.0)
MCV: 87.6 fL (ref 78.0–100.0)
Platelets: 263 10*3/uL (ref 150–400)
RBC: 5.16 MIL/uL (ref 4.22–5.81)
RDW: 12.5 % (ref 11.5–15.5)
WBC: 11.3 10*3/uL — ABNORMAL HIGH (ref 4.0–10.5)

## 2017-09-24 LAB — COMPREHENSIVE METABOLIC PANEL
ALT: 22 U/L (ref 17–63)
AST: 27 U/L (ref 15–41)
Albumin: 4.6 g/dL (ref 3.5–5.0)
Alkaline Phosphatase: 56 U/L (ref 38–126)
Anion gap: 6 (ref 5–15)
BUN: 12 mg/dL (ref 6–20)
CO2: 28 mmol/L (ref 22–32)
Calcium: 9.3 mg/dL (ref 8.9–10.3)
Chloride: 106 mmol/L (ref 101–111)
Creatinine, Ser: 1.14 mg/dL (ref 0.61–1.24)
GFR calc Af Amer: >60 mL/min (ref >60)
GFR calc non Af Amer: >60 mL/min (ref >60)
Glucose, Bld: 96 mg/dL (ref 65–99)
Potassium: 3.9 mmol/L (ref 3.5–5.1)
Sodium: 140 mmol/L (ref 135–145)
Total Bilirubin: 0.6 mg/dL (ref 0.3–1.2)
Total Protein: 7.1 g/dL (ref 6.5–8.1)

## 2017-09-24 LAB — URINALYSIS, ROUTINE W REFLEX MICROSCOPIC
Bilirubin Urine: NEGATIVE
Glucose, UA: NEGATIVE mg/dL
Hgb urine dipstick: NEGATIVE
Ketones, ur: 20 mg/dL — AB
Leukocytes, UA: NEGATIVE
Nitrite: NEGATIVE
Protein, ur: NEGATIVE mg/dL
Specific Gravity, Urine: 1.024 (ref 1.005–1.030)
pH: 5 (ref 5.0–8.0)

## 2017-09-24 LAB — LIPASE, BLOOD: Lipase: 27 U/L (ref 11–51)

## 2017-09-24 MED ORDER — KETOROLAC TROMETHAMINE 60 MG/2ML IM SOLN
60.0000 mg | Freq: Once | INTRAMUSCULAR | Status: AC
  Administered 2017-09-24: 60 mg via INTRAMUSCULAR
  Filled 2017-09-24: qty 2

## 2017-09-24 NOTE — ED Notes (Signed)
[facility] and left prior to discharge  Here for abd discomfort

## 2017-09-24 NOTE — ED Provider Notes (Signed)
[facility] EMERGENCY DEPARTMENT Provider Note   CSN: [id number] Arrival date & time: 09/24/17  2107     History   Chief Complaint Chief Complaint  Patient presents with  . Abdominal Pain    HPI Jose Carr is a 27 y.o. male with a history of ADHD, asthma, and nephrolithiasis who presents to the emergency department with a chief complaint of left flank pain.  The patient endorses sudden onset, constant left flank pain that radiates around to the left upper quadrant, and right mid back that began this afternoon after he ate a [place] sandwich for lunch.  He reports associated 2 episodes of nonbloody, nonbilious emesis this afternoon and intermittent nausea.  Last episode was several hours ago.  He had nonbloody diarrhea yesterday, which is since resolved.  He has been able to keep down water without difficulty.  He denies fever, chills, constipation penile or testicular pain or swelling, increased flatus or belching, dyspnea, or chest pain.   He left work early and went to [facility].  He had blood drawn, but left due to the long wait.  He took 400 mg of Advil around 1600 that significantly improved his pain for couple of hours.  The history is provided by the patient. No language interpreter was used.    Past Medical History:  Diagnosis Date  . ADHD (attention deficit hyperactivity disorder)   . Asthma   . COPD (chronic obstructive pulmonary disease) (HCC)     There are no active problems to display for this patient.   Past Surgical History:  Procedure Laterality Date  . CARPAL TUNNEL RELEASE    . HAND SURGERY    . HAND SURGERY    . KNEE SURGERY    . KNEE SURGERY          Home Medications    Prior to Admission medications   Medication Sig Start Date End Date Taking? Authorizing Provider  albuterol (PROVENTIL HFA;VENTOLIN HFA) 108 (90 Base) MCG/ACT inhaler Inhale 1-2 puffs into the lungs every 6 (six) hours as needed for wheezing or shortness of breath. 09/28/15   Yes Santiago Glad, PA-C  ibuprofen (ADVIL,MOTRIN) 800 MG tablet Take 1 tablet (800 mg total) by mouth 3 (three) times daily. 09/25/17   Kienna Moncada A, PA-C  methocarbamol (ROBAXIN) 500 MG tablet Take 1 tablet (500 mg total) by mouth 2 (two) times daily. 09/25/17   Mosie Angus A, PA-C    Family History Family History  Problem Relation Age of Onset  . Hypertension Father     Social History Social History   Tobacco Use  . Smoking status: Current Every Day Smoker    Packs/day: 0.50    Types: Cigarettes  . Smokeless tobacco: Never Used  Substance Use Topics  . Alcohol use: Not Currently  . Drug use: Yes    Types: Marijuana    Comment: daily use     Allergies   Patient has no known allergies.   Review of Systems Review of Systems  Constitutional: Negative for appetite change, chills and fever.  HENT: Negative for congestion and sore throat.   Respiratory: Negative for shortness of breath.   Cardiovascular: Negative for chest pain.  Gastrointestinal: Positive for abdominal pain, diarrhea (resolved), nausea and vomiting. Negative for abdominal distention and constipation.  Genitourinary: Positive for flank pain. Negative for dysuria, penile pain, penile swelling, scrotal swelling, testicular pain and urgency.  Musculoskeletal: Negative for back pain.  Skin: Negative for rash.  Allergic/Immunologic: Negative for immunocompromised state.  Neurological: Negative for weakness, numbness and headaches.  Psychiatric/Behavioral: Negative for confusion.   Physical Exam Updated Vital Signs BP 122/70   Pulse 71   Temp 98.1 F (36.7 C) (Temporal)   Resp 18   Ht  (1.702 m)   Wt 108.9 kg (240 lb)   SpO2 94%   BMI 37.59 kg/m   Physical Exam  Constitutional: He appears well-developed.  HENT:  Head: Normocephalic.  Eyes: Conjunctivae are normal.  Neck: Neck supple.  Cardiovascular: Normal rate, regular rhythm, normal heart sounds and intact distal pulses. Exam reveals  no gallop and no friction rub.  No murmur heard. Pulmonary/Chest: Effort normal and breath sounds normal. No stridor. No respiratory distress. He has no wheezes. He has no rales. He exhibits no tenderness.  Abdominal: Soft. Bowel sounds are normal. He exhibits no distension and no mass. There is tenderness in the left lower quadrant. There is no rebound and no guarding. No hernia.  Tender to palpation over the left CVA.  Right CVA is nontender.  He is also mildly tender to palpation over the left lower quadrant.  The remainder of the abdominal exam is unremarkable.  Abdomen is soft, nondistended.  Normoactive bowel sounds.  No peritoneal signs.  Musculoskeletal: Normal range of motion. He exhibits edema. He exhibits no tenderness or deformity.  Neurological: He is alert.  Skin: Skin is warm and dry.  Psychiatric: His behavior is normal.  Nursing note and vitals reviewed.    ED Treatments / Results  Labs (all labs ordered are listed, but only abnormal results are displayed) Labs Reviewed  URINALYSIS, ROUTINE W REFLEX MICROSCOPIC - Abnormal; Notable for the following components:      Result Value   APPearance HAZY (*)    Ketones, ur 20 (*)    All other components within normal limits    EKG None  Radiology Ct Renal Stone Study  Result Date: 09/24/2017 CLINICAL DATA:  Acute onset of sharp right-sided abdominal pain, left shoulder pain and lower back pain. EXAM: CT ABDOMEN AND PELVIS WITHOUT CONTRAST TECHNIQUE: Multidetector CT imaging of the abdomen and pelvis was performed following the standard protocol without IV contrast. COMPARISON:  None. FINDINGS: Lower chest: Mild bibasilar atelectasis is noted. The visualized portions of the mediastinum are unremarkable. Hepatobiliary: The liver is unremarkable in appearance. The gallbladder is unremarkable in appearance. The common bile duct remains normal in caliber. Pancreas: The pancreas is within normal limits. Spleen: The spleen is  unremarkable in appearance. Adrenals/Urinary Tract: The adrenal glands are unremarkable in appearance. The kidneys are within normal limits. There is no evidence of hydronephrosis. No renal or ureteral stones are identified. No perinephric stranding is seen. Stomach/Bowel: The stomach is unremarkable in appearance. The small bowel is within normal limits. The appendix is normal in caliber, without evidence of appendicitis. The colon is unremarkable in appearance. Vascular/Lymphatic: The abdominal aorta is unremarkable in appearance. The inferior vena cava is grossly unremarkable. No retroperitoneal lymphadenopathy is seen. No pelvic sidewall lymphadenopathy is identified. Reproductive: The bladder is decompressed and not well assessed. The prostate remains normal in size. Other: No additional soft tissue abnormalities are seen. Musculoskeletal: No acute osseous abnormalities are identified. The visualized musculature is unremarkable in appearance. IMPRESSION: 1. No acute abnormality seen within the abdomen or pelvis. 2. Mild bibasilar atelectasis noted. Electronically Signed   By: Roanna Raider M.D.   On: 09/24/2017 23:33    Procedures Procedures (including critical care time)  Medications Ordered in ED Medications  ketorolac (TORADOL)  injection 60 mg (60 mg Intramuscular Given 09/24/17 2222)     Initial Impression / Assessment and Plan / ED Course  I have reviewed the triage vital signs and the nursing notes.  Pertinent labs & imaging results that were available during my care of the patient were reviewed by me and considered in my medical decision making (see chart for details).     27 year old male with a history of ADHD and asthma who presents to the emergency department with left flank pain and NBNB emesis x2.  Exam, the patient has left CVA tenderness and is tender to palpation without rebound or guarding in the left lower quadrant.  He took Advil at home earlier today with significant  improvement in his symptoms for several hours.  He was successfully fluid challenged in the ED without antiemetics.  Labs from earlier ED visit reviewed.  Urinalysis obtained with mild ketonuria, but otherwise unremarkable.  Given the patient's history of nephrolithiasis, CT stone study obtained, which was negative.  These findings were discussed with the patient.  He reports improvement in his symptoms from Toradol injection.   On repeat exam patient does not have a surgical abdomen and there are no peritoneal signs.  No indication of appendicitis, bowel obstruction, bowel perforation, cholecystitis, urolithiasis, or diverticulitis. Patient discharged home with symptomatic treatment and given strict instructions for follow-up with their primary care physician or GI given recurrent abdominal symptoms.  I have also discussed reasons to return immediately to the ER.  Return precautions given.  Patient is hemodynamically stable and in no acute distress.  Patient expresses understanding and agrees with plan.  Final Clinical Impressions(s) / ED Diagnoses   Final diagnoses:  Left flank pain  Non-intractable vomiting without nausea, unspecified vomiting type    ED Discharge Orders        Ordered    ibuprofen (ADVIL,MOTRIN) 800 MG tablet  3 times daily     09/25/17 0003    methocarbamol (ROBAXIN) 500 MG tablet  2 times daily     09/25/17 0003       Maryl Blalock A, PA-C 09/25/17 0012    Raeford Razor, MD 09/25/17 [phone]

## 2017-09-24 NOTE — ED Notes (Signed)
Previous call to PA re: labs?  Awaiting evaluation

## 2017-09-24 NOTE — ED Notes (Signed)
Called patient to go to a room and no answer 

## 2017-09-24 NOTE — ED Triage Notes (Signed)
Patient c/o right upper quadrant pain, nausea and left shoulder blade pain and occurs after eating. Patient states that the pain was worse today.

## 2017-09-24 NOTE — ED Notes (Signed)
Pt reports that he has pain under his shoulder and into his back after he eats  [facility], but he did not see a physician choosing to come home and come here  Pt has new MD appt 6/6 with Dr [facility]

## 2017-09-24 NOTE — ED Triage Notes (Addendum)
Pt c/o right quad abd pain and to left shoulder and lower back pain that started today after eating. Pt states that he went to [facility] today and had blood work performed but did not stay for treatment,

## 2017-09-24 NOTE — ED Notes (Signed)
Called patient to room and no answer. 

## 2017-09-24 NOTE — ED Notes (Signed)
Pt took sips from his drink before CT with no adverse effects.

## 2017-09-25 MED ORDER — METHOCARBAMOL 500 MG PO TABS: 500.0000 mg | ORAL_TABLET | Freq: Two times a day (BID) | ORAL | 0 refills | Status: DC

## 2017-09-25 MED ORDER — IBUPROFEN 800 MG PO TABS: 800.0000 mg | ORAL_TABLET | Freq: Three times a day (TID) | ORAL | 0 refills | Status: DC

## 2017-09-25 NOTE — Discharge Instructions (Addendum)
You have been seen in the Emergency Department (ED) for abdominal and flank pain.  Your evaluation did not identify a clear cause of your symptoms but was generally reassuring.  Please follow up as instructed above regarding today?s emergent visit and the symptoms that are bothering you.  Take 800 mg of ibuprofen with food every 8 hours for pain.  Do not use other medications such as Advil, Motrin, naproxen, or NSAIDs while you are taking this medication.  Take 1 tablet of Robaxin 2 times daily.  Some muscle relaxer and may help with muscle pain and spasms.  If your symptoms do not improve after several days of this regimen, you can call gastroenterology to schedule a follow-up appointment or follow-up with your primary care provider.  Return to the ED if your abdominal pain worsens or fails to improve, you develop bloody vomiting, bloody diarrhea, you are unable to tolerate fluids due to vomiting, fever greater than 101, or other symptoms that concern you.

## 2017-09-30 ENCOUNTER — Ambulatory Visit (INDEPENDENT_AMBULATORY_CARE_PROVIDER_SITE_OTHER): Payer: BLUE CROSS/BLUE SHIELD | Admitting: Family Medicine

## 2017-09-30 ENCOUNTER — Encounter: Payer: Self-pay | Admitting: Family Medicine

## 2017-09-30 ENCOUNTER — Other Ambulatory Visit: Payer: Self-pay

## 2017-09-30 VITALS — BP 128/70 | HR 76 | Temp 97.3°F | Resp 18 | Ht 67.0 in | Wt 249.6 lb

## 2017-09-30 DIAGNOSIS — F418 Other specified anxiety disorders: Secondary | ICD-10-CM | POA: Diagnosis not present

## 2017-09-30 DIAGNOSIS — F1290 Cannabis use, unspecified, uncomplicated: Secondary | ICD-10-CM

## 2017-09-30 DIAGNOSIS — R1084 Generalized abdominal pain: Secondary | ICD-10-CM | POA: Diagnosis not present

## 2017-09-30 DIAGNOSIS — M545 Low back pain, unspecified: Secondary | ICD-10-CM

## 2017-09-30 DIAGNOSIS — D72829 Elevated white blood cell count, unspecified: Secondary | ICD-10-CM | POA: Diagnosis not present

## 2017-09-30 MED ORDER — SERTRALINE HCL 50 MG PO TABS: 50.0000 mg | ORAL_TABLET | Freq: Every day | ORAL | 1 refills | Status: DC

## 2017-09-30 NOTE — Patient Instructions (Addendum)
For over thinking symptoms, some of those may be related to anxiety or depression.  I would recommend meeting with a therapist, but starting a daily medication may also be helpful.  If any return of suicidal thoughts - be seen in the emergency room, call 911 or suicide hotline.   Vivia Budge: [phone] [place]vil [place] or [place]: [phone]  Start zoloft once per day. Recheck in next 3-4 weeks. If any manic symptoms as we discussed, return right away.  For abdominal symptoms, I would recommend meeting with a gastroenterologist.  I will recheck your blood count as that was borderline in the emergency room. I would recommend cutting back on marijuana as that can worsen nausea and vomiting.  Some of the back pain could be related to muscular pain.  Tylenol over-the-counter is okay for now.  Recheck in the next 1 week to 10 days if that pain is not improved.  Sooner or to the emergency room if worse.   Return to the clinic or go to the nearest emergency room if any of your symptoms worsen or new symptoms occur.  Stress and Stress Management Stress is a normal reaction to life events. It is what you feel when life demands more than you are used to or more than you can handle. Some stress can be useful. For example, the stress reaction can help you catch the last bus of the day, study for a test, or meet a deadline at work. But stress that occurs too often or for too long can cause problems. It can affect your emotional health and interfere with relationships and normal daily activities. Too much stress can weaken your immune system and increase your risk for physical illness. If you already have a medical problem, stress can make it worse. What are the causes? All sorts of life events may cause stress. An event that causes stress for one person may not be stressful for another person. Major life events commonly cause stress. These may be positive or negative. Examples include losing your job, moving into  a new home, getting married, having a baby, or losing a loved one. Less obvious life events may also cause stress, especially if they occur day after day or in combination. Examples include working long hours, driving in traffic, caring for children, being in debt, or being in a difficult relationship. What are the signs or symptoms? Stress may cause emotional symptoms including, the following:  Anxiety. This is feeling worried, afraid, on edge, overwhelmed, or out of control.  Anger. This is feeling irritated or impatient.  Depression. This is feeling sad, down, helpless, or guilty.  Difficulty focusing, remembering, or making decisions.  Stress may cause physical symptoms, including the following:  Aches and pains. These may affect your head, neck, back, stomach, or other areas of your body.  Tight muscles or clenched jaw.  Low energy or trouble sleeping.  Stress may cause unhealthy behaviors, including the following:  Eating to feel better (overeating) or skipping meals.  Sleeping too little, too much, or both.  Working too much or putting off tasks (procrastination).  Smoking, drinking alcohol, or using drugs to feel better.  How is this diagnosed? Stress is diagnosed through an assessment by your health care provider. Your health care provider will ask questions about your symptoms and any stressful life events.Your health care provider will also ask about your medical history and may order blood tests or other tests. Certain medical conditions and medicine can cause physical symptoms similar  to stress. Mental illness can cause emotional symptoms and unhealthy behaviors similar to stress. Your health care provider may refer you to a mental health professional for further evaluation. How is this treated? Stress management is the recommended treatment for stress.The goals of stress management are reducing stressful life events and coping with stress in healthy ways. Techniques  for reducing stressful life events include the following:  Stress identification. Self-monitor for stress and identify what causes stress for you. These skills may help you to avoid some stressful events.  Time management. Set your priorities, keep a calendar of events, and learn to say "no." These tools can help you avoid making too many commitments.  Techniques for coping with stress include the following:  Rethinking the problem. Try to think realistically about stressful events rather than ignoring them or overreacting. Try to find the positives in a stressful situation rather than focusing on the negatives.  Exercise. Physical exercise can release both physical and emotional tension. The key is to find a form of exercise you enjoy and do it regularly.  Relaxation techniques. These relax the body and mind. Examples include yoga, meditation, tai chi, biofeedback, deep breathing, progressive muscle relaxation, listening to music, being out in nature, journaling, and other hobbies. Again, the key is to find one or more that you enjoy and can do regularly.  Healthy lifestyle. Eat a balanced diet, get plenty of sleep, and do not smoke. Avoid using alcohol or drugs to relax.  Strong support network. Spend time with family, friends, or other people you enjoy being around.Express your feelings and talk things over with someone you trust.  Counseling or talktherapy with a mental health professional may be helpful if you are having difficulty managing stress on your own. Medicine is typically not recommended for the treatment of stress.Talk to your health care provider if you think you need medicine for symptoms of stress. Follow these instructions at home:  Keep all follow-up visits as directed by your health care provider.  Take all medicines as directed by your health care provider. Contact a health care provider if:  Your symptoms get worse or you start having new symptoms.  You feel  overwhelmed by your problems and can no longer manage them on your own. Get help right away if:  You feel like hurting yourself or someone else. This information is not intended to replace advice given to you by your health care provider. Make sure you discuss any questions you have with your health care provider. Document Released: 10/07/2000 Document Revised: 09/19/2015 Document Reviewed: 12/06/2012 Elsevier Interactive Patient Education  2017 [employer].   Major Depressive Disorder, Adult Major depressive disorder (MDD) is a mental health condition. MDD often makes you feel sad, hopeless, or helpless. MDD can also cause symptoms in your body. MDD can affect your:  Work.  School.  Relationships.  Other normal activities.  MDD can range from mild to very bad. It may occur once (single episode MDD). It can also occur many times (recurrent MDD). The main symptoms of MDD often include:  Feeling sad, depressed, or irritable most of the time.  Loss of interest.  MDD symptoms also include:  Sleeping too much or too little.  Eating too much or too little.  A change in your weight.  Feeling tired (fatigue) or having low energy.  Feeling worthless.  Feeling guilty.  Trouble making decisions.  Trouble thinking clearly.  Thoughts of suicide or harming others.  Feeling weak.  Feeling agitated.  Keeping yourself from being around other people (isolation).  Follow these instructions at home: Activity  Do these things as told by your doctor: ? Go back to your normal activities. ? Exercise regularly. ? Spend time outdoors. Alcohol  Talk with your doctor about how alcohol can affect your antidepressant medicines.  Do not drink alcohol. Or, limit how much alcohol you drink. ? This means no more than 1 drink a day for nonpregnant women and 2 drinks a day for men. One drink equals one of these:  12 oz of beer.  5 oz of wine.  1 oz of hard liquor. General  instructions  Take over-the-counter and prescription medicines only as told by your doctor.  Eat a healthy diet.  Get plenty of sleep.  Find activities that you enjoy. Make time to do them.  Think about joining a support group. Your doctor may be able to suggest a group for you.  Keep all follow-up visits as told by your doctor. This is important. Where to find more information:  [employer] on Mental Illness: ? www.nami.[place]: ? https://carter.com/  National Suicide Prevention Lifeline: ? 6611703263. This is free, 24-hour help. Contact a doctor if:  Your symptoms get worse.  You have new symptoms. Get help right away if:  You self-harm.  You see, hear, taste, smell, or feel things that are not present (hallucinate). If you ever feel like you may hurt yourself or others, or have thoughts about taking your own life, get help right away. You can go to your nearest emergency department or call:  Your local emergency services (911 in the [place]  A suicide crisis helpline, such as the National Suicide Prevention Lifeline: ? 215-332-9782. This is open 24 hours a day.  This information is not intended to replace advice given to you by your health care provider. Make sure you discuss any questions you have with your health care provider. Document Released: 03/25/2015 Document Revised: 12/29/2015 Document Reviewed: 12/29/2015 Elsevier Interactive Patient Education  2017 [employer].  Abdominal Pain, Adult Abdominal pain can be caused by many things. Often, abdominal pain is not serious and it gets better with no treatment or by being treated at home. However, sometimes abdominal pain is serious. Your health care provider will do a medical history and a physical exam to try to determine the cause of your abdominal pain. Follow these instructions at home:  Take over-the-counter and prescription medicines only as told by your  health care provider. Do not take a laxative unless told by your health care provider.  Drink enough fluid to keep your urine clear or pale yellow.  Watch your condition for any changes.  Keep all follow-up visits as told by your health care provider. This is important. Contact a health care provider if:  Your abdominal pain changes or gets worse.  You are not hungry or you lose weight without trying.  You are constipated or have diarrhea for more than 2-3 days.  You have pain when you urinate or have a bowel movement.  Your abdominal pain wakes you up at night.  Your pain gets worse with meals, after eating, or with certain foods.  You are throwing up and cannot keep anything down.  You have a fever. Get help right away if:  Your pain does not go away as soon as your health care provider told you to expect.  You cannot stop throwing up.  Your pain is only in  areas of the abdomen, such as the right side or the left lower portion of the abdomen.  You have bloody or black stools, or stools that look like tar.  You have severe pain, cramping, or bloating in your abdomen.  You have signs of dehydration, such as: ? Dark urine, very little urine, or no urine. ? Cracked lips. ? Dry mouth. ? Sunken eyes. ? Sleepiness. ? Weakness. This information is not intended to replace advice given to you by your health care provider. Make sure you discuss any questions you have with your health care provider. Document Released: 01/21/2005 Document Revised: 11/01/2015 Document Reviewed: 09/25/2015 Elsevier Interactive Patient Education  2018 [employer].   IF you received an x-ray today, you will receive an invoice from [facility]. Please contact [facility] at [phone] with questions or concerns regarding your invoice.   IF you received labwork today, you will receive an invoice from [facility]. Please contact [facility] at [phone] with questions or concerns  regarding your invoice.   Our billing staff will not be able to assist you with questions regarding bills from these companies.  You will be contacted with the lab results as soon as they are available. The fastest way to get your results is to activate your My Chart account. Instructions are located on the last page of this paperwork. If you have not heard from [place] regarding the results in 2 weeks, please contact this office.

## 2017-09-30 NOTE — Progress Notes (Signed)
Subjective:  By signing my name below, I, Jose Carr, attest that this documentation has been prepared under the direction and in the presence of Jose Agreste, MD Electronically Signed: Ladene Artist, ED Scribe 09/30/2017 at 11:19 AM.   Patient ID: Jose Carr, male    DOB: [birth year], 27 y.o.   MRN: [id number]  Chief Complaint  Patient presents with  . Establish Care    has some mental concerns    HPI [name] is a 27 y.o. male who presents to Primary Care at [facility] to establish care. New pt to our office. Medial hx indicated prior asthma vs COPD. Was treated for bronchitis with asthma in 2017 at [facility]. Clear CXR at that time. Treated with prednisone, albuterol, hycodan cough syrup. Seen at [facility] 05/2015 with cp and dyspnea, suspected COPD. Also treated with prednisone. EKG reportedly reassuring at that time.  Abdominal Pain Pt reports R sided abdominal pain and L flank pain that tends to occur after eating. States he initially thought pain was attributed to eating pork but he has also noticed pain with having a piece of toast as well. Also reports that he wakes with nausea that self-resolves. He reports 1 episode of emesis when he was seen on 5/31 at [place]. UA was overall reassuring. CT renal stone study, no acute abnormalities in abdomen/pelvis. Normal CMP, lipase borderline WBC of 11.3. Appendix with normal without evidence of appendicitis. Pt denies fever, recent vomiting, h/o thyroid disease.  Anxiety/Depression Paranoia and over thought of things that doesn't matter. Pt states that if a thought enters his brain, he dwells on it and the end-result, even if it will have no result on his life. States "I'm trying to let go of things that don't matter and pay attention to what does like my family and my kids". He has been using marijuana once-twice a day since age 55 but states he now self-medicates with it to level out his nerves. He reports a lot of anxiety,  depression and increased stress x 6 months. Pt has never taken meds for anxiety or depression. Also reports a fleeting suicidal thoughts a few months ago when he was in a dark place but states he never had a plan, none recently. States he would never actually act on it since he doesn't want to hurt his wife or children. Denies hallucinations, staying awake for 2-3 days. Father and sister with a h/o addiction to illicit drugs. Father with a h/o bipolar and schizophrenia. He is currently trying to quit smoking.  L Hand Pain Pt reports pshx of carpal tunnel release on R. States L hand is beginning to show similar symptoms. He plans to return to further discuss this.  There are no active problems to display for this patient.  Past Medical History:  Diagnosis Date  . ADHD (attention deficit hyperactivity disorder)   . Asthma   . COPD (chronic obstructive pulmonary disease) ([place])    Past Surgical History:  Procedure Laterality Date  . CARPAL TUNNEL RELEASE    . HAND SURGERY    . HAND SURGERY    . KNEE SURGERY    . KNEE SURGERY     No Known Allergies Prior to Admission medications   Medication Sig Start Date End Date Taking? Authorizing Provider  albuterol (PROVENTIL HFA;VENTOLIN HFA) 108 (90 Base) MCG/ACT inhaler Inhale 1-2 puffs into the lungs every 6 (six) hours as needed for wheezing or shortness of breath. 09/28/15  Yes [name], PA-C  ibuprofen (ADVIL,MOTRIN) 800 MG tablet Take 1 tablet (800 mg total) by mouth 3 (three) times daily. Patient not taking: Reported on 09/30/2017 09/25/17   [name], [name], PA-C  methocarbamol (ROBAXIN) 500 MG tablet Take 1 tablet (500 mg total) by mouth 2 (two) times daily. Patient not taking: Reported on 09/30/2017 09/25/17   [name], PA-C   Social History   Socioeconomic History  . Marital status: Single    Spouse name: Not on file  . Number of children: Not on file  . Years of education: Not on file  . Highest education level: Not on file    Occupational History  . Not on file  Social Needs  . Financial resource strain: Not on file  . Food insecurity:    Worry: Not on file    Inability: Not on file  . Transportation needs:    Medical: Not on file    Non-medical: Not on file  Tobacco Use  . Smoking status: Current Every Day Smoker    Packs/day: 0.50    Types: Cigarettes  . Smokeless tobacco: Never Used  Substance and Sexual Activity  . Alcohol use: Not Currently  . Drug use: Yes    Types: Marijuana    Comment: daily use  . Sexual activity: Not on file  Lifestyle  . Physical activity:    Days per week: Not on file    Minutes per session: Not on file  . Stress: Not on file  Relationships  . Social connections:    Talks on phone: Not on file    Gets together: Not on file    Attends religious service: Not on file    Active member of club or organization: Not on file    Attends meetings of clubs or organizations: Not on file    Relationship status: Not on file  . Intimate partner violence:    Fear of current or ex partner: Not on file    Emotionally abused: Not on file    Physically abused: Not on file    Forced sexual activity: Not on file  Other Topics Concern  . Not on file  Social History Narrative  . Not on file   Review of Systems  Constitutional: Negative for fever.  Gastrointestinal: Positive for abdominal pain and nausea. Negative for vomiting (resolved).  Genitourinary: Positive for flank pain.  Musculoskeletal: Positive for arthralgias.  Psychiatric/Behavioral: Positive for dysphoric mood and sleep disturbance. Negative for hallucinations. The patient is nervous/anxious.       Objective:   Physical Exam  Constitutional: He is oriented to person, place, and time. He appears well-developed and well-nourished. No distress.  HENT:  Head: Normocephalic and atraumatic.  Eyes: Conjunctivae and EOM are normal.  Neck: Neck supple. No tracheal deviation present.  Cardiovascular: Normal rate.   Pulmonary/Chest: Effort normal. No respiratory distress.  Abdominal: There is tenderness (slight) in the left lower quadrant. There is tenderness at McBurney's point (minimal tenderness).  Slight sensitivity in LLQ.  Musculoskeletal: Normal range of motion.  Slightly tender over R lower paraspinals.  Neurological: He is alert and oriented to person, place, and time.  Skin: Skin is warm and dry.  Psychiatric: He has a normal mood and affect. His behavior is normal.  Nursing note and vitals reviewed.  Vitals:   09/30/17 1046  BP: 128/70  Pulse: 76  Resp: 18  Temp: (!) 97.3 F (36.3 C)  TempSrc: Oral  SpO2: 95%  Weight: 249 lb 9.6 oz (113.2 kg)  Height: '5\' 7"'$  (1.702 m)   Results for orders placed or performed in visit on 09/30/17  CBC  Result Value Ref Range   WBC 6.7 3.4 - 10.8 x10E3/uL   RBC 4.96 4.14 - 5.80 x10E6/uL   Hemoglobin 14.9 13.0 - 17.7 g/dL   Hematocrit 44.3 37.5 - 51.0 %   MCV 89 79 - 97 fL   MCH 30.0 26.6 - 33.0 pg   MCHC 33.6 31.5 - 35.7 g/dL   RDW 13.2 12.3 - 15.4 %   Platelets 230 150 - 450 x10E3/uL       Assessment & Plan:    Nassim Cosma is a 27 y.o. male Leukocytosis, unspecified type - Plan: CBC Generalized abdominal pain - Plan: Ambulatory referral to Gastroenterology  -Prior leukocytosis, improved on current testing.  Recent imaging also overall reassuring.  Will refer to gastroenterology, starting Zoloft for depression symptoms as below may also provide some benefit if he does have component of IBS.  Also discussed discontinuing or at least decreasing marijuana use as that may have contributed to the nausea/vomiting.  ER/RTC precautions if acute worsening  Depression with anxiety Marijuana use  -Suspected previous anxiety along with ADD, now with more depressive symptoms.  Denies recent suicidal ideation/intent or plan but did have prior symptoms.  Appears to be self treating with marijuana.  -Phone numbers provided for therapist-for  CBT  -Start Zoloft 50 mg daily, potential side effects/risk discussed with recheck in 3 to 4 weeks.  -Hold on medication for ADD symptoms at this time, would consider repeat  formal testing as may have more component of anxiety versus ADD.  Right-sided low back pain without sciatica, unspecified chronicity  -No red flags on exam/history.  Mechanical pain possible.  Symptomatic care discussed initially with Tylenol and RTC precautions if persistent.  Meds ordered this encounter  Medications  . sertraline (ZOLOFT) 50 MG tablet    Sig: Take 1 tablet (50 mg total) by mouth daily.    Dispense:  30 tablet    Refill:  1   Patient Instructions   For over thinking symptoms, some of those may be related to anxiety or depression.  I would recommend meeting with a therapist, but starting a daily medication may also be helpful.  If any return of suicidal thoughts - be seen in the emergency room, call 911 or suicide hotline.   Vivia Budge: [phone] [place]vil [place] or [place]: [phone]  Start zoloft once per day. Recheck in next 3-4 weeks. If any manic symptoms as we discussed, return right away.  For abdominal symptoms, I would recommend meeting with a gastroenterologist.  I will recheck your blood count as that was borderline in the emergency room. I would recommend cutting back on marijuana as that can worsen nausea and vomiting.  Some of the back pain could be related to muscular pain.  Tylenol over-the-counter is okay for now.  Recheck in the next 1 week to 10 days if that pain is not improved.  Sooner or to the emergency room if worse.   Return to the clinic or go to the nearest emergency room if any of your symptoms worsen or new symptoms occur.  Stress and Stress Management Stress is a normal reaction to life events. It is what you feel when life demands more than you are used to or more than you can handle. Some stress can be useful. For example, the stress reaction can help you catch the  last bus of the day, study  for a test, or meet a deadline at work. But stress that occurs too often or for too long can cause problems. It can affect your emotional health and interfere with relationships and normal daily activities. Too much stress can weaken your immune system and increase your risk for physical illness. If you already have a medical problem, stress can make it worse. What are the causes? All sorts of life events may cause stress. An event that causes stress for one person may not be stressful for another person. Major life events commonly cause stress. These may be positive or negative. Examples include losing your job, moving into a new home, getting married, having a baby, or losing a loved one. Less obvious life events may also cause stress, especially if they occur day after day or in combination. Examples include working long hours, driving in traffic, caring for children, being in debt, or being in a difficult relationship. What are the signs or symptoms? Stress may cause emotional symptoms including, the following:  Anxiety. This is feeling worried, afraid, on edge, overwhelmed, or out of control.  Anger. This is feeling irritated or impatient.  Depression. This is feeling sad, down, helpless, or guilty.  Difficulty focusing, remembering, or making decisions.  Stress may cause physical symptoms, including the following:  Aches and pains. These may affect your head, neck, back, stomach, or other areas of your body.  Tight muscles or clenched jaw.  Low energy or trouble sleeping.  Stress may cause unhealthy behaviors, including the following:  Eating to feel better (overeating) or skipping meals.  Sleeping too little, too much, or both.  Working too much or putting off tasks (procrastination).  Smoking, drinking alcohol, or using drugs to feel better.  How is this diagnosed? Stress is diagnosed through an assessment by your health care provider. Your health  care provider will ask questions about your symptoms and any stressful life events.Your health care provider will also ask about your medical history and may order blood tests or other tests. Certain medical conditions and medicine can cause physical symptoms similar to stress. Mental illness can cause emotional symptoms and unhealthy behaviors similar to stress. Your health care provider may refer you to a mental health professional for further evaluation. How is this treated? Stress management is the recommended treatment for stress.The goals of stress management are reducing stressful life events and coping with stress in healthy ways. Techniques for reducing stressful life events include the following:  Stress identification. Self-monitor for stress and identify what causes stress for you. These skills may help you to avoid some stressful events.  Time management. Set your priorities, keep a calendar of events, and learn to say "no." These tools can help you avoid making too many commitments.  Techniques for coping with stress include the following:  Rethinking the problem. Try to think realistically about stressful events rather than ignoring them or overreacting. Try to find the positives in a stressful situation rather than focusing on the negatives.  Exercise. Physical exercise can release both physical and emotional tension. The key is to find a form of exercise you enjoy and do it regularly.  Relaxation techniques. These relax the body and mind. Examples include yoga, meditation, tai chi, biofeedback, deep breathing, progressive muscle relaxation, listening to music, being out in nature, journaling, and other hobbies. Again, the key is to find one or more that you enjoy and can do regularly.  Healthy lifestyle. Eat a balanced diet, get plenty of sleep, and do  not smoke. Avoid using alcohol or drugs to relax.  Strong support network. Spend time with family, friends, or other people you  enjoy being around.Express your feelings and talk things over with someone you trust.  Counseling or talktherapy with a mental health professional may be helpful if you are having difficulty managing stress on your own. Medicine is typically not recommended for the treatment of stress.Talk to your health care provider if you think you need medicine for symptoms of stress. Follow these instructions at home:  Keep all follow-up visits as directed by your health care provider.  Take all medicines as directed by your health care provider. Contact a health care provider if:  Your symptoms get worse or you start having new symptoms.  You feel overwhelmed by your problems and can no longer manage them on your own. Get help right away if:  You feel like hurting yourself or someone else. This information is not intended to replace advice given to you by your health care provider. Make sure you discuss any questions you have with your health care provider. Document Released: 10/07/2000 Document Revised: 09/19/2015 Document Reviewed: 12/06/2012 Elsevier Interactive Patient Education  2017 [employer].   Major Depressive Disorder, Adult Major depressive disorder (MDD) is a mental health condition. MDD often makes you feel sad, hopeless, or helpless. MDD can also cause symptoms in your body. MDD can affect your:  Work.  School.  Relationships.  Other normal activities.  MDD can range from mild to very bad. It may occur once (single episode MDD). It can also occur many times (recurrent MDD). The main symptoms of MDD often include:  Feeling sad, depressed, or irritable most of the time.  Loss of interest.  MDD symptoms also include:  Sleeping too much or too little.  Eating too much or too little.  A change in your weight.  Feeling tired (fatigue) or having low energy.  Feeling worthless.  Feeling guilty.  Trouble making decisions.  Trouble thinking clearly.  Thoughts of  suicide or harming others.  Feeling weak.  Feeling agitated.  Keeping yourself from being around other people (isolation).  Follow these instructions at home: Activity  Do these things as told by your doctor: ? Go back to your normal activities. ? Exercise regularly. ? Spend time outdoors. Alcohol  Talk with your doctor about how alcohol can affect your antidepressant medicines.  Do not drink alcohol. Or, limit how much alcohol you drink. ? This means no more than 1 drink a day for nonpregnant women and 2 drinks a day for men. One drink equals one of these:  12 oz of beer.  5 oz of wine.  1 oz of hard liquor. General instructions  Take over-the-counter and prescription medicines only as told by your doctor.  Eat a healthy diet.  Get plenty of sleep.  Find activities that you enjoy. Make time to do them.  Think about joining a support group. Your doctor may be able to suggest a group for you.  Keep all follow-up visits as told by your doctor. This is important. Where to find more information:  [employer] on Mental Illness: ? www.nami.[place]: ? https://carter.com/  National Suicide Prevention Lifeline: ? 8031117221. This is free, 24-hour help. Contact a doctor if:  Your symptoms get worse.  You have new symptoms. Get help right away if:  You self-harm.  You see, hear, taste, smell, or feel things that are not present (hallucinate). If  you ever feel like you may hurt yourself or others, or have thoughts about taking your own life, get help right away. You can go to your nearest emergency department or call:  Your local emergency services (911 in the [place]  A suicide crisis helpline, such as the National Suicide Prevention Lifeline: ? 226 458 1392. This is open 24 hours a day.  This information is not intended to replace advice given to you by your health care provider. Make sure you discuss any  questions you have with your health care provider. Document Released: 03/25/2015 Document Revised: 12/29/2015 Document Reviewed: 12/29/2015 Elsevier Interactive Patient Education  2017 Else[employer] Inc.  Abdominal Pain, Adult Abdominal pain can be caused by many things. Often, abdominal pain is not serious and it gets better with no treatment or by being treated at home. However, sometimes abdominal pain is serious. Your health care provider will do a medical history and a physical exam to try to determine the cause of your abdominal pain. Follow these instructions at home:  Take over-the-counter and prescription medicines only as told by your health care provider. Do not take a laxative unless told by your health care provider.  Drink enough fluid to keep your urine clear or pale yellow.  Watch your condition for any changes.  Keep all follow-up visits as told by your health care provider. This is important. Contact a health care provider if:  Your abdominal pain changes or gets worse.  You are not hungry or you lose weight without trying.  You are constipated or have diarrhea for more than 2-3 days.  You have pain when you urinate or have a bowel movement.  Your abdominal pain wakes you up at night.  Your pain gets worse with meals, after eating, or with certain foods.  You are throwing up and cannot keep anything down.  You have a fever. Get help right away if:  Your pain does not go away as soon as your health care provider told you to expect.  You cannot stop throwing up.  Your pain is only in areas of the abdomen, such as the right side or the left lower portion of the abdomen.  You have bloody or black stools, or stools that look like tar.  You have severe pain, cramping, or bloating in your abdomen.  You have signs of dehydration, such as: ? Dark urine, very little urine, or no urine. ? Cracked lips. ? Dry mouth. ? Sunken eyes. ? Sleepiness. ? Weakness. This  information is not intended to replace advice given to you by your health care provider. Make sure you discuss any questions you have with your health care provider. Document Released: 01/21/2005 Document Revised: 11/01/2015 Document Reviewed: 09/25/2015 Elsevier Interactive Patient Education  2018 [employer].   IF you received an x-ray today, you will receive an invoice from [facility]. Please contact [facility] at [phone] with questions or concerns regarding your invoice.   IF you received labwork today, you will receive an invoice from [place] [facility]. Please contact [facility] at [phone] with questions or concerns regarding your invoice.   Our billing staff will not be able to assist you with questions regarding bills from these companies.  You will be contacted with the lab results as soon as they are available. The fastest way to get your results is to activate your My Chart account. Instructions are located on the last page of this paperwork. If you have not heard from [place] regarding the results in 2 weeks,  please contact this office.      I personally performed the services described in this documentation, which was scribed in my presence. The recorded information has been reviewed and considered for accuracy and completeness, addended by me as needed, and agree with information above.  Signed,   Merri Ray, MD Primary [facility] at [place].  10/01/17 11:52 AM

## 2017-10-01 ENCOUNTER — Encounter (HOSPITAL_COMMUNITY): Payer: Self-pay

## 2017-10-01 ENCOUNTER — Emergency Department (HOSPITAL_COMMUNITY)
Admission: EM | Admit: 2017-10-01 | Discharge: 2017-10-01 | Disposition: A | Discharge status: Home / Self Care | Payer: BLUE CROSS/BLUE SHIELD | Attending: Emergency Medicine | Admitting: Emergency Medicine

## 2017-10-01 ENCOUNTER — Other Ambulatory Visit: Payer: Self-pay

## 2017-10-01 DIAGNOSIS — Z79899 Other long term (current) drug therapy: Secondary | ICD-10-CM | POA: Insufficient documentation

## 2017-10-01 DIAGNOSIS — F17210 Nicotine dependence, cigarettes, uncomplicated: Secondary | ICD-10-CM | POA: Insufficient documentation

## 2017-10-01 DIAGNOSIS — L259 Unspecified contact dermatitis, unspecified cause: Secondary | ICD-10-CM

## 2017-10-01 DIAGNOSIS — R21 Rash and other nonspecific skin eruption: Secondary | ICD-10-CM | POA: Diagnosis not present

## 2017-10-01 DIAGNOSIS — J449 Chronic obstructive pulmonary disease, unspecified: Secondary | ICD-10-CM | POA: Insufficient documentation

## 2017-10-01 LAB — CBC
Hematocrit: 44.3 % (ref 37.5–51.0)
Hemoglobin: 14.9 g/dL (ref 13.0–17.7)
MCH: 30 pg (ref 26.6–33.0)
MCHC: 33.6 g/dL (ref 31.5–35.7)
MCV: 89 fL (ref 79–97)
Platelets: 230 10*3/uL (ref 150–450)
RBC: 4.96 x10E6/uL (ref 4.14–5.80)
RDW: 13.2 % (ref 12.3–15.4)
WBC: 6.7 10*3/uL (ref 3.4–10.8)

## 2017-10-01 MED ORDER — TRIAMCINOLONE ACETONIDE 0.1 % EX CREA: 1.0000 "application " | TOPICAL_CREAM | Freq: Two times a day (BID) | CUTANEOUS | 0 refills | Status: DC

## 2017-10-01 MED ORDER — HYDROXYZINE HCL 25 MG PO TABS
25.0000 mg | ORAL_TABLET | Freq: Four times a day (QID) | ORAL | 0 refills | Status: DC
Start: 2017-10-01 — End: 2017-11-06

## 2017-10-01 NOTE — ED Triage Notes (Signed)
patient c/o rash to bilateral arms, face, abdomen x 2 days. Patient states he could not come to work with rash.

## 2017-10-01 NOTE — ED Provider Notes (Signed)
[facility]-EMERGENCY DEPT Provider Note   CSN: [id number] Arrival date & time: 10/01/17  1012     History   Chief Complaint Chief Complaint  Patient presents with  . Rash    HPI Jose Carr is a [age] y.o. male with a PMHx of COPD, ADHD, asthma, and substance abuse, who presents to the ED with complaints of generalized rash that began yesterday.  Patient states that [place] the science [place], had touched the stingray's and shortly afterwards he started having a rash.  He states that the rash is pruritic and erythematous, somewhat warm to the touch but not very hot, and is generalized everywhere on both legs and arms and his flank areas.  This morning his wife woke up with a similar rash but not as extensive, only in a small area on her arm.  His kids do not have any rashes, and they were all in contact with the same things.  He recently changed detergents and body wash, and has had allergic reactions before, but he thought it was all that his wife also had a slightly similar rash to him so he was not sure whether this could be the cause.  He denies any changes in medications, creams, lotions, or plant contact.  He is Claritin and cortisone cream which helped some, no known aggravating factors.  He denies any weeping or drainage, fevers, chills, swelling, facial swelling, drooling, difficulty swallowing or breathing, shortness of breath, wheezing, or any other complaints at this time.  The history is provided by the patient and medical records. No language interpreter was used.  Rash      Past Medical History:  Diagnosis Date  . ADHD (attention deficit hyperactivity disorder)   . Asthma   . COPD (chronic obstructive pulmonary disease) (HCC)   . Substance abuse (HCC)     There are no active problems to display for this patient.   Past Surgical History:  Procedure Laterality Date  . CARPAL TUNNEL RELEASE    . HAND SURGERY    . HAND SURGERY    .  KNEE SURGERY    . KNEE SURGERY          Home Medications    Prior to Admission medications   Medication Sig Start Date End Date Taking? Authorizing Provider  albuterol (PROVENTIL HFA;VENTOLIN HFA) 108 (90 Base) MCG/ACT inhaler Inhale 1-2 puffs into the lungs every 6 (six) hours as needed for wheezing or shortness of breath. 09/28/15   Santiago GladLaisure, Heather, PA-C  sertraline (ZOLOFT) 50 MG tablet Take 1 tablet (50 mg total) by mouth daily. 09/30/17   Shade FloodGreene, Jeffrey R, MD    Family History Family History  Problem Relation Age of Onset  . Hypertension Father     Social History Social History   Tobacco Use  . Smoking status: Current Every Day Smoker    Packs/day: 0.50    Types: Cigarettes  . Smokeless tobacco: Never Used  Substance Use Topics  . Alcohol use: Not Currently  . Drug use: Yes    Types: Marijuana    Comment: daily use     Allergies   Patient has no known allergies.   Review of Systems Review of Systems  Constitutional: Negative for fever.  HENT: Negative for drooling, facial swelling and trouble swallowing.   Respiratory: Negative for shortness of breath and wheezing.   Skin: Positive for rash.  Allergic/Immunologic: Negative for immunocompromised state.     Physical Exam Updated Vital Signs  BP (!) 129/59   Pulse 79   Temp 98.1 F (36.7 C) (Oral)   Resp 15   Ht 5\' 7"  (1.702 m)   Wt 112.9 kg (249 lb)   SpO2 98%   BMI 39.00 kg/m   Physical Exam  Constitutional: He is oriented to person, place, and time. Vital signs are normal. He appears well-developed and well-nourished.  Non-toxic appearance. No distress.  Afebrile, nontoxic, NAD  HENT:  Head: Normocephalic and atraumatic.  Mouth/Throat: Mucous membranes are normal.  Eyes: Conjunctivae and EOM are normal. Right eye exhibits no discharge. Left eye exhibits no discharge.  Neck: Normal range of motion. Neck supple.  Cardiovascular: Normal rate and intact distal pulses.  Pulmonary/Chest: Effort  normal. No respiratory distress.  Abdominal: Normal appearance. He exhibits no distension.  Musculoskeletal: Normal range of motion.  Neurological: He is alert and oriented to person, place, and time. He has normal strength. No sensory deficit.  Skin: Skin is warm, dry and intact. Rash noted. Rash is urticarial.  Erythematous urticarial rash to b/l arms, legs, and b/l flank areas, no vesicles or pustules, no drainage/weeping, no evidence of cellulitis. No interdigital webspace involvement. No burrowing or evidence of scabies. No evidence of ringworm. Does not appear to be shingles, crosses the midline.   Psychiatric: He has a normal mood and affect.  Nursing note and vitals reviewed.    ED Treatments / Results  Labs (all labs ordered are listed, but only abnormal results are displayed) Labs Reviewed - No data to display  EKG None  Radiology No results found.  Procedures Procedures (including critical care time)  Medications Ordered in ED Medications - No data to display   Initial Impression / Assessment and Plan / ED Course  I have reviewed the triage vital signs and the nursing notes.  Pertinent labs & imaging results that were available during my care of the patient were reviewed by me and considered in my medical decision making (see chart for details).     27 y.o. male here with rash x1 day, started after going to science center, had touched the sting rays. Also recently changed body wash and detergents. His wife had a small rash this morning as well that looked similar but no other affected individuals at home. On exam, urticarial rash to b/l arms, legs, and flank areas, no evidence of infection, no vesicles or pustules, no ringworm, does not look like scabies. Likely contact dermatitis. Advised avoidance of triggers, changing back to prior detergent/soap, will send home with vistaril and triamcinolone cream, advised other OTC remedies, and f/up with PCP in 1wk. I explained  the diagnosis and have given explicit precautions to return to the ER including for any other new or worsening symptoms. The patient understands and accepts the medical plan as it's been dictated and I have answered their questions. Discharge instructions concerning home care and prescriptions have been given. The patient is STABLE and is discharged to home in good condition.    Final Clinical Impressions(s) / ED Diagnoses   Final diagnoses:  Contact dermatitis, unspecified contact dermatitis type, unspecified trigger  Rash    ED Discharge Orders        Ordered    hydrOXYzine (ATARAX/VISTARIL) 25 MG tablet  Every 6 hours     10/01/17 1353    triamcinolone cream (KENALOG) 0.1 %  2 times daily     10/01/17 [address], [place], [place] 10/01/17 1359    Raeford Razor,  MD 10/01/17 1413

## 2017-10-01 NOTE — ED Notes (Signed)
Called out in lobby for patient 3 times. Will attempt again in 10 to 15 minutes.

## 2017-10-01 NOTE — Discharge Instructions (Signed)
Take vistaril as directed, as needed for itching. Use triamcinolone cream as prescribed. Continue your usual home medications. Get plenty of rest and drink plenty of fluids. Avoid any known triggers, change back to your old detergents/lotions/etc. Please followup with your primary doctor in 1 week for recheck of symptoms and for discussion of your diagnoses and further evaluation after today's visit. Return to the ER for changes or worsening symptoms

## 2017-10-07 ENCOUNTER — Encounter: Payer: Self-pay | Admitting: Gastroenterology

## 2017-10-07 ENCOUNTER — Ambulatory Visit: Payer: BLUE CROSS/BLUE SHIELD | Admitting: Family Medicine

## 2017-10-21 ENCOUNTER — Ambulatory Visit: Payer: BLUE CROSS/BLUE SHIELD | Admitting: Gastroenterology

## 2017-11-06 ENCOUNTER — Ambulatory Visit (INDEPENDENT_AMBULATORY_CARE_PROVIDER_SITE_OTHER): Payer: BLUE CROSS/BLUE SHIELD

## 2017-11-06 ENCOUNTER — Ambulatory Visit (INDEPENDENT_AMBULATORY_CARE_PROVIDER_SITE_OTHER): Payer: BLUE CROSS/BLUE SHIELD | Admitting: Family Medicine

## 2017-11-06 ENCOUNTER — Encounter: Payer: Self-pay | Admitting: Family Medicine

## 2017-11-06 VITALS — BP 109/75 | HR 64 | Temp 98.3°F | Resp 17 | Ht 67.0 in | Wt 241.0 lb

## 2017-11-06 DIAGNOSIS — R0789 Other chest pain: Secondary | ICD-10-CM | POA: Diagnosis not present

## 2017-11-06 DIAGNOSIS — R05 Cough: Secondary | ICD-10-CM

## 2017-11-06 DIAGNOSIS — R079 Chest pain, unspecified: Secondary | ICD-10-CM

## 2017-11-06 DIAGNOSIS — R1011 Right upper quadrant pain: Secondary | ICD-10-CM

## 2017-11-06 DIAGNOSIS — R059 Cough, unspecified: Secondary | ICD-10-CM

## 2017-11-06 DIAGNOSIS — F418 Other specified anxiety disorders: Secondary | ICD-10-CM

## 2017-11-06 DIAGNOSIS — F1290 Cannabis use, unspecified, uncomplicated: Secondary | ICD-10-CM

## 2017-11-06 MED ORDER — SERTRALINE HCL 50 MG PO TABS: 50.0000 mg | ORAL_TABLET | Freq: Every day | ORAL | 1 refills | Status: DC

## 2017-11-06 MED ORDER — AZITHROMYCIN 250 MG PO TABS: ORAL_TABLET | ORAL | 0 refills | Status: DC

## 2017-11-06 NOTE — Patient Instructions (Addendum)
Keep follow with gastroenterology to determine if further gallbladder testing may be needed. Continue to avoid fried or fatty food for now, as well as keep up the good work with cutting back on marijuana use.   Continue Zoloft at same dose for now, but we can recheck in next 2-3 months to see if higher dose is needed.  X-ray and EKG look okay today.  See information below on chest pain.  With the productive cough, can start antibiotic. If not improving, return for recheck here or the emergency room as we discussed. Quitting smoking is also recommended. See info below, but let me know if I can help further.   Return to the clinic or go to the nearest emergency room if any of your symptoms worsen or new symptoms occur.  Nonspecific Chest Pain Chest pain can be caused by many different conditions. There is always a chance that your pain could be related to something serious, such as a heart attack or a blood clot in your lungs. Chest pain can also be caused by conditions that are not life-threatening. If you have chest pain, it is very important to follow up with your health care provider. What are the causes? Causes of this condition include:  Heartburn.  Pneumonia or bronchitis.  Anxiety or stress.  Inflammation around your heart (pericarditis) or lung (pleuritis or pleurisy).  A blood clot in your lung.  A collapsed lung (pneumothorax). This can develop suddenly on its own (spontaneous pneumothorax) or from trauma to the chest.  Shingles infection (varicella-zoster virus).  Heart attack.  Damage to the bones, muscles, and cartilage that make up your chest wall. This can include: ? Bruised bones due to injury. ? Strained muscles or cartilage due to frequent or repeated coughing or overwork. ? Fracture to one or more ribs. ? Sore cartilage due to inflammation (costochondritis).  What increases the risk? Risk factors for this condition may include:  Activities that increase your  risk for trauma or injury to your chest.  Respiratory infections or conditions that cause frequent coughing.  Medical conditions or overeating that can cause heartburn.  Heart disease or family history of heart disease.  Conditions or health behaviors that increase your risk of developing a blood clot.  Having had chicken pox (varicella zoster).  What are the signs or symptoms? Chest pain can feel like:  Burning or tingling on the surface of your chest or deep in your chest.  Crushing, pressure, aching, or squeezing pain.  Dull or sharp pain that is worse when you move, cough, or take a deep breath.  Pain that is also felt in your back, neck, shoulder, or arm, or pain that spreads to any of these areas.  Your chest pain may come and go, or it may stay constant. How is this diagnosed? Lab tests or other studies may be needed to find the cause of your pain. Your health care provider may have you take a test called an ECG (electrocardiogram). An ECG records your heartbeat patterns at the time the test is performed. You may also have other tests, such as:  Transthoracic echocardiogram (TTE). In this test, sound waves are used to create a picture of the heart structures and to look at how blood flows through your heart.  Transesophageal echocardiogram (TEE).This is a more advanced imaging test that takes images from inside your body. It allows your health care provider to see your heart in finer detail.  Cardiac monitoring. This allows your health care  provider to monitor your heart rate and rhythm in real time.  Holter monitor. This is a portable device that records your heartbeat and can help to diagnose abnormal heartbeats. It allows your health care provider to track your heart activity for several days, if needed.  Stress tests. These can be done through exercise or by taking medicine that makes your heart beat more quickly.  Blood tests.  Other imaging tests.  How is this  treated? Treatment depends on what is causing your chest pain. Treatment may include:  Medicines. These may include: ? Acid blockers for heartburn. ? Anti-inflammatory medicine. ? Pain medicine for inflammatory conditions. ? Antibiotic medicine, if an infection is present. ? Medicines to dissolve blood clots. ? Medicines to treat coronary artery disease (CAD).  Supportive care for conditions that do not require medicines. This may include: ? Resting. ? Applying heat or cold packs to injured areas. ? Limiting activities until pain decreases.  Follow these instructions at home: Medicines  If you were prescribed an antibiotic, take it as told by your health care provider. Do not stop taking the antibiotic even if you start to feel better.  Take over-the-counter and prescription medicines only as told by your health care provider. Lifestyle  Do not use any products that contain nicotine or tobacco, such as cigarettes and e-cigarettes. If you need help quitting, ask your health care provider.  Do not drink alcohol.  Make lifestyle changes as directed by your health care provider. These may include: ? Getting regular exercise. Ask your health care provider to suggest some activities that are safe for you. ? Eating a heart-healthy diet. A registered dietitian can help you to learn healthy eating options. ? Maintaining a healthy weight. ? Managing diabetes, if necessary. ? Reducing stress, such as with yoga or relaxation techniques. General instructions  Avoid any activities that bring on chest pain.  If heartburn is the cause for your chest pain, raise (elevate) the head of your bed about 6 inches (15 cm) by putting blocks under the legs. Sleeping with more pillows does not effectively relieve heartburn because it only changes the position of your head.  Keep all follow-up visits as told by your health care provider. This is important. This includes any further testing if your chest  pain does not go away. Contact a health care provider if:  Your chest pain does not go away.  You have a rash with blisters on your chest.  You have a fever.  You have chills. Get help right away if:  Your chest pain is worse.  You have a cough that gets worse, or you cough up blood.  You have severe pain in your abdomen.  You have severe weakness.  You faint.  You have sudden, unexplained chest discomfort.  You have sudden, unexplained discomfort in your arms, back, neck, or jaw.  You have shortness of breath at any time.  You suddenly start to sweat, or your skin gets clammy.  You feel nauseous or you vomit.  You suddenly feel light-headed or dizzy.  Your heart begins to beat quickly, or it feels like it is skipping beats. These symptoms may represent a serious problem that is an emergency. Do not wait to see if the symptoms will go away. Get medical help right away. Call your local emergency services (911 in the [place] Do not drive yourself to the hospital. This information is not intended to replace advice given to you by your health care provider. Make  sure you discuss any questions you have with your health care provider. Document Released: 01/21/2005 Document Revised: 01/06/2016 Document Reviewed: 01/06/2016 Elsevier Interactive Patient Education  2017 [employer].   Steps to Quit Smoking Smoking tobacco can be harmful to your health and can affect almost every organ in your body. Smoking puts you, and those around you, at risk for developing many serious chronic diseases. Quitting smoking is difficult, but it is one of the best things that you can do for your health. It is never too late to quit. What are the benefits of quitting smoking? When you quit smoking, you lower your risk of developing serious diseases and conditions, such as:  Lung cancer or lung disease, such as COPD.  Heart disease.  Stroke.  Heart attack.  Infertility.  Osteoporosis and  bone fractures.  Additionally, symptoms such as coughing, wheezing, and shortness of breath may get better when you quit. You may also find that you get sick less often because your body is stronger at fighting off colds and infections. If you are pregnant, quitting smoking can help to reduce your chances of having a baby of low birth weight. How do I get ready to quit? When you decide to quit smoking, create a plan to make sure that you are successful. Before you quit:  Pick a date to quit. Set a date within the next two weeks to give you time to prepare.  Write down the reasons why you are quitting. Keep this list in places where you will see it often, such as on your bathroom mirror or in your car or wallet.  Identify the people, places, things, and activities that make you want to smoke (triggers) and avoid them. Make sure to take these actions: ? Throw away all cigarettes at home, at work, and in your car. ? Throw away smoking accessories, such as Set designer. ? Clean your car and make sure to empty the ashtray. ? Clean your home, including curtains and carpets.  Tell your family, friends, and coworkers that you are quitting. Support from your loved ones can make quitting easier.  Talk with your health care provider about your options for quitting smoking.  Find out what treatment options are covered by your health insurance.  What strategies can I use to quit smoking? Talk with your healthcare provider about different strategies to quit smoking. Some strategies include:  Quitting smoking altogether instead of gradually lessening how much you smoke over a period of time. Research shows that quitting "cold [place]" is more successful than gradually quitting.  Attending in-person counseling to help you build problem-solving skills. You are more likely to have success in quitting if you attend several counseling sessions. Even short sessions of 10 minutes can be  effective.  Finding resources and support systems that can help you to quit smoking and remain smoke-free after you quit. These resources are most helpful when you use them often. They can include: ? Online chats with a Veterinary surgeon. ? Telephone quitlines. ? Automotive engineer. ? Support groups or group counseling. ? Text messaging programs. ? Mobile phone applications.  Taking medicines to help you quit smoking. (If you are pregnant or breastfeeding, talk with your health care provider first.) Some medicines contain nicotine and some do not. Both types of medicines help with cravings, but the medicines that include nicotine help to relieve withdrawal symptoms. Your health care provider may recommend: ? Nicotine patches, gum, or lozenges. ? Nicotine inhalers or sprays. ? Non-nicotine  medicine that is taken by mouth.  Talk with your health care provider about combining strategies, such as taking medicines while you are also receiving in-person counseling. Using these two strategies together makes you more likely to succeed in quitting than if you used either strategy on its own. If you are pregnant or breastfeeding, talk with your health care provider about finding counseling or other support strategies to quit smoking. Do not take medicine to help you quit smoking unless told to do so by your health care provider. What things can I do to make it easier to quit? Quitting smoking might feel overwhelming at first, but there is a lot that you can do to make it easier. Take these important actions:  Reach out to your family and friends and ask that they support and encourage you during this time. Call telephone quitlines, reach out to support groups, or work with a counselor for support.  Ask people who smoke to avoid smoking around you.  Avoid places that trigger you to smoke, such as bars, parties, or smoke-break areas at work.  Spend time around people who do not smoke.  Lessen stress  in your life, because stress can be a smoking trigger for some people. To lessen stress, try: ? Exercising regularly. ? Deep-breathing exercises. ? Yoga. ? Meditating. ? Performing a body scan. This involves closing your eyes, scanning your body from head to toe, and noticing which parts of your body are particularly tense. Purposefully relax the muscles in those areas.  Download or purchase mobile phone or tablet apps (applications) that can help you stick to your quit plan by providing reminders, tips, and encouragement. There are many free apps, such as QuitGuide from the [employer] developer(Centers for Disease Control and Prevention). You can find other support for quitting smoking (smoking cessation) through smokefree.gov and other websites.  How will I feel when I quit smoking? Within the first 24 hours of quitting smoking, you may start to feel some withdrawal symptoms. These symptoms are usually most noticeable 2-3 days after quitting, but they usually do not last beyond 2-3 weeks. Changes or symptoms that you might experience include:  Mood swings.  Restlessness, anxiety, or irritation.  Difficulty concentrating.  Dizziness.  Strong cravings for sugary foods in addition to nicotine.  Mild weight gain.  Constipation.  Nausea.  Coughing or a sore throat.  Changes in how your medicines work in your body.  A depressed mood.  Difficulty sleeping (insomnia).  After the first 2-3 weeks of quitting, you may start to notice more positive results, such as:  Improved sense of smell and taste.  Decreased coughing and sore throat.  Slower heart rate.  Lower blood pressure.  Clearer skin.  The ability to breathe more easily.  Fewer sick days.  Quitting smoking is very challenging for most people. Do not get discouraged if you are not successful the first time. Some people need to make many attempts to quit before they achieve long-term success. Do your best to stick to your quit plan,  and talk with your health care provider if you have any questions or concerns. This information is not intended to replace advice given to you by your health care provider. Make sure you discuss any questions you have with your health care provider. Document Released: 04/07/2001 Document Revised: 12/10/2015 Document Reviewed: 08/28/2014 Elsevier Interactive Patient Education  2018 Elsevier Inc.  Cough, Adult Coughing is a reflex that clears your throat and your airways. Coughing helps to heal  and protect your lungs. It is normal to cough occasionally, but a cough that happens with other symptoms or lasts a long time may be a sign of a condition that needs treatment. A cough may last only 2-3 weeks (acute), or it may last longer than 8 weeks (chronic). What are the causes? Coughing is commonly caused by:  Breathing in substances that irritate your lungs.  A viral or bacterial respiratory infection.  Allergies.  Asthma.  Postnasal drip.  Smoking.  Acid backing up from the stomach into the esophagus (gastroesophageal reflux).  Certain medicines.  Chronic lung problems, including COPD (or rarely, lung cancer).  Other medical conditions such as heart failure.  Follow these instructions at home: Pay attention to any changes in your symptoms. Take these actions to help with your discomfort:  Take medicines only as told by your health care provider. ? If you were prescribed an antibiotic medicine, take it as told by your health care provider. Do not stop taking the antibiotic even if you start to feel better. ? Talk with your health care provider before you take a cough suppressant medicine.  Drink enough fluid to keep your urine clear or pale yellow.  If the air is dry, use a cold steam vaporizer or humidifier in your bedroom or your home to help loosen secretions.  Avoid anything that causes you to cough at work or at home.  If your cough is worse at night, try sleeping in a  semi-upright position.  Avoid cigarette smoke. If you smoke, quit smoking. If you need help quitting, ask your health care provider.  Avoid caffeine.  Avoid alcohol.  Rest as needed.  Contact a health care provider if:  You have new symptoms.  You cough up pus.  Your cough does not get better after 2-3 weeks, or your cough gets worse.  You cannot control your cough with suppressant medicines and you are losing sleep.  You develop pain that is getting worse or pain that is not controlled with pain medicines.  You have a fever.  You have unexplained weight loss.  You have night sweats. Get help right away if:  You cough up blood.  You have difficulty breathing.  Your heartbeat is very fast. This information is not intended to replace advice given to you by your health care provider. Make sure you discuss any questions you have with your health care provider. Document Released: 10/10/2010 Document Revised: 09/19/2015 Document Reviewed: 06/20/2014 Elsevier Interactive Patient Education  2018 [employer].  IF you received an x-ray today, you will receive an invoice from [facility]. Please contact [facility] at [phone] with questions or concerns regarding your invoice.   IF you received labwork today, you will receive an invoice from [facility]. Please contact [facility] at [phone] with questions or concerns regarding your invoice.   Our billing staff will not be able to assist you with questions regarding bills from these companies.  You will be contacted with the lab results as soon as they are available. The fastest way to get your results is to activate your My Chart account. Instructions are located on the last page of this paperwork. If you have not heard from [place] regarding the results in 2 weeks, please contact this office.

## 2017-11-06 NOTE — Progress Notes (Signed)
Subjective:  By signing my name below, I, Stann Ore, attest that this documentation has been prepared under the direction and in the presence of Meredith Staggers, MD. Electronically Signed: Stann Ore, Scribe. 11/06/2017 , 12:22 PM .  Patient was seen in Room 13 .   Patient ID: Jose Carr, male    DOB: Aug 23, 1990, 27 y.o.   MRN: [id number] Chief Complaint  Patient presents with  . Follow-up    stomach pain    HPI Jose Carr is a 27 y.o. male  Here for follow up of abdominal pain, last seen on June 6th. He was a new patient to establish care at that time. He had complained of right sided abdominal pain and flank pain that occurred after eating. He still has an occasional nausea that self resolves. He had 1 episode of emesis in May, and was seen in the ER. He had a reassuring urinalysis, and no acute abnormality on CT renal stone study. He had a normal CMP and lipase; appendix without signs of infection. He had borderline WBC of 11.3 on May 31st. This was repeated and normal at last visit on June 6th. He has an appointment with GI scheduled in 3 days.   Patient states he's still having intermittent discomfort after eating, worse with greasy and fried foods. At last visit, he was started on Zoloft 50 mg for depression and anxiety. He reports Zoloft took a lot of the edge off, and feeling more normal while on it, "leveling out". He isn't as frantic and not jumping to conclusions. With improvement on Zoloft, he's been cutting back on marijuana use. His wife informed him that he's been normal agitated, but believes it's due to cutting back on marijuana. He denies sexual side effects.   He also mentions having a left sided chest pain occurring more in the last few days; sometimes lasting about 1-2 hour(s), and sometimes all day. He's had it occur intermittently in the past. He's noticed it more when he's smoking cigarettes more heavily. He is coughing up discolored phlegm that was brownish  yellow. He denies nausea or vomiting. He isn't able to reproduce the chest pain with palpation. He denies history of heart problems. He denies cocaine use. He denies any persistent calf pain. He does have swelling in his lower extremities, that improve over night and elevation. He denies history of blood clots. He denies any long distance travel.   There are no active problems to display for this patient.  Past Medical History:  Diagnosis Date  . ADHD (attention deficit hyperactivity disorder)   . Asthma   . COPD (chronic obstructive pulmonary disease) (HCC)   . Substance abuse [facility])    Past Surgical History:  Procedure Laterality Date  . CARPAL TUNNEL RELEASE    . HAND SURGERY    . HAND SURGERY    . KNEE SURGERY    . KNEE SURGERY     No Known Allergies Prior to Admission medications   Medication Sig Start Date End Date Taking? Authorizing Provider  albuterol (PROVENTIL HFA;VENTOLIN HFA) 108 (90 Base) MCG/ACT inhaler Inhale 1-2 puffs into the lungs every 6 (six) hours as needed for wheezing or shortness of breath. 09/28/15  Yes Santiago Glad, PA-C  sertraline (ZOLOFT) 50 MG tablet Take 1 tablet (50 mg total) by mouth daily. Patient not taking: Reported on 11/06/2017 09/30/17   Shade Flood, MD   Social History   Socioeconomic History  . Marital status: Single    Spouse name:  Not on file  . Number of children: Not on file  . Years of education: Not on file  . Highest education level: Not on file  Occupational History  . Not on file  Social Needs  . Financial resource strain: Not on file  . Food insecurity:    Worry: Not on file    Inability: Not on file  . Transportation needs:    Medical: Not on file    Non-medical: Not on file  Tobacco Use  . Smoking status: Current Every Day Smoker    Packs/day: 0.50    Types: Cigarettes  . Smokeless tobacco: Never Used  Substance and Sexual Activity  . Alcohol use: Not Currently  . Drug use: Yes    Types: Marijuana     Comment: daily use  . Sexual activity: Not on file  Lifestyle  . Physical activity:    Days per week: Not on file    Minutes per session: Not on file  . Stress: Not on file  Relationships  . Social connections:    Talks on phone: Not on file    Gets together: Not on file    Attends religious service: Not on file    Active member of club or organization: Not on file    Attends meetings of clubs or organizations: Not on file    Relationship status: Not on file  . Intimate partner violence:    Fear of current or ex partner: Not on file    Emotionally abused: Not on file    Physically abused: Not on file    Forced sexual activity: Not on file  Other Topics Concern  . Not on file  Social History Narrative  . Not on file   Review of Systems  Constitutional: Negative for fatigue and unexpected weight change.  HENT: Positive for congestion.   Eyes: Negative for visual disturbance.  Respiratory: Positive for cough. Negative for chest tightness and shortness of breath.   Cardiovascular: Positive for chest pain. Negative for palpitations and leg swelling.  Gastrointestinal: Negative for abdominal pain, blood in stool, nausea and vomiting.  Neurological: Negative for dizziness, light-headedness and headaches.       Objective:   Physical Exam  Constitutional: He is oriented to person, place, and time. He appears well-developed and well-nourished. No distress.  HENT:  Head: Normocephalic and atraumatic.  Eyes: Pupils are equal, round, and reactive to light. EOM are normal.  Neck: Neck supple.  Cardiovascular: Normal rate, regular rhythm and normal heart sounds. Exam reveals no gallop and no friction rub.  No murmur heard. Pulmonary/Chest: Effort normal and breath sounds normal. No respiratory distress.  Unable to reproduce chest pain with palpation  Musculoskeletal: Normal range of motion.  No calf pain  Neurological: He is alert and oriented to person, place, and time.  Skin:  Skin is warm and dry.  Psychiatric: He has a normal mood and affect. His behavior is normal.  Nursing note and vitals reviewed.   Vitals:   11/06/17 1138  BP: 109/75  Pulse: 64  Resp: 17  Temp: 98.3 F (36.8 C)  TempSrc: Oral  SpO2: 98%  Weight: 241 lb (109.3 kg)  Height: 5\' 7"  (1.702 m)   EKG: sinus bradycardia rate 57, no acute findings.   Dg Chest 2 View  Result Date: 11/06/2017 CLINICAL DATA:  27 year old male with history of left-sided chest pain. EXAM: CHEST - 2 VIEW COMPARISON:  Chest x-ray 09/28/2015. FINDINGS: Lung volumes are normal. No consolidative airspace  disease. No pleural effusions. No pneumothorax. No pulmonary nodule or mass noted. Pulmonary vasculature and the cardiomediastinal silhouette are within normal limits. IMPRESSION: No radiographic evidence of acute cardiopulmonary disease. Electronically Signed   By: Trudie Reed M.D.   On: 11/06/2017 12:30       Assessment & Plan:    Jose Carr is a 27 y.o. male Depression with anxiety - Plan: sertraline (ZOLOFT) 50 MG tablet  -Improved, tolerating current dose of Zoloft.  Commended on cutting back on marijuana use and avoiding illicit drug use in general.  Recheck next few months, decide if increased dose needed at that time.  RTC precautions if worsening  RUQ abdominal pain  -Improved, follow-up with gastroenterology as planned to decide if other testing for gallbladder disease needed.  Continue to avoid fried/fatty foods for now.  Marijuana use  -Commended on decrease use, work towards complete abstinence.  Left-sided chest pain - Plan: DG Chest 2 View, EKG 12-Lead Cough - Plan: DG Chest 2 View, azithromycin (ZITHROMAX) 250 MG tablet  -Reports previous chest pain, now recurrence past few days but also admits to cough as above that has been productive.  History of tobacco use.  Does not appear to be cardiac in nature, and no significant risk factors for PE.  Vital signs reassuring.  Lower respiratory  infection/bronchitis possible.  Reassuring EKG and chest x-ray.  -Start Z-Pak, symptomatic care, RTC/ER precautions and handout given on chest pain  Meds ordered this encounter  Medications  . sertraline (ZOLOFT) 50 MG tablet    Sig: Take 1 tablet (50 mg total) by mouth daily.    Dispense:  90 tablet    Refill:  1  . azithromycin (ZITHROMAX) 250 MG tablet    Sig: Take 2 pills by mouth on day 1, then 1 pill by mouth per day on days 2 through 5.    Dispense:  6 tablet    Refill:  0   Patient Instructions    Keep follow with gastroenterology to determine if further gallbladder testing may be needed. Continue to avoid fried or fatty food for now, as well as keep up the good work with cutting back on marijuana use.   Continue Zoloft at same dose for now, but we can recheck in next 2-3 months to see if higher dose is needed.  X-ray and EKG look okay today.  See information below on chest pain.  With the productive cough, can start antibiotic. If not improving, return for recheck here or the emergency room as we discussed. Quitting smoking is also recommended. See info below, but let me know if I can help further.   Return to the clinic or go to the nearest emergency room if any of your symptoms worsen or new symptoms occur.  Nonspecific Chest Pain Chest pain can be caused by many different conditions. There is always a chance that your pain could be related to something serious, such as a heart attack or a blood clot in your lungs. Chest pain can also be caused by conditions that are not life-threatening. If you have chest pain, it is very important to follow up with your health care provider. What are the causes? Causes of this condition include:  Heartburn.  Pneumonia or bronchitis.  Anxiety or stress.  Inflammation around your heart (pericarditis) or lung (pleuritis or pleurisy).  A blood clot in your lung.  A collapsed lung (pneumothorax). This can develop suddenly on its own  (spontaneous pneumothorax) or from trauma to the  chest.  Shingles infection (varicella-zoster virus).  Heart attack.  Damage to the bones, muscles, and cartilage that make up your chest wall. This can include: ? Bruised bones due to injury. ? Strained muscles or cartilage due to frequent or repeated coughing or overwork. ? Fracture to one or more ribs. ? Sore cartilage due to inflammation (costochondritis).  What increases the risk? Risk factors for this condition may include:  Activities that increase your risk for trauma or injury to your chest.  Respiratory infections or conditions that cause frequent coughing.  Medical conditions or overeating that can cause heartburn.  Heart disease or family history of heart disease.  Conditions or health behaviors that increase your risk of developing a blood clot.  Having had chicken pox (varicella zoster).  What are the signs or symptoms? Chest pain can feel like:  Burning or tingling on the surface of your chest or deep in your chest.  Crushing, pressure, aching, or squeezing pain.  Dull or sharp pain that is worse when you move, cough, or take a deep breath.  Pain that is also felt in your back, neck, shoulder, or arm, or pain that spreads to any of these areas.  Your chest pain may come and go, or it may stay constant. How is this diagnosed? Lab tests or other studies may be needed to find the cause of your pain. Your health care provider may have you take a test called an ECG (electrocardiogram). An ECG records your heartbeat patterns at the time the test is performed. You may also have other tests, such as:  Transthoracic echocardiogram (TTE). In this test, sound waves are used to create a picture of the heart structures and to look at how blood flows through your heart.  Transesophageal echocardiogram (TEE).This is a more advanced imaging test that takes images from inside your body. It allows your health care provider to  see your heart in finer detail.  Cardiac monitoring. This allows your health care provider to monitor your heart rate and rhythm in real time.  Holter monitor. This is a portable device that records your heartbeat and can help to diagnose abnormal heartbeats. It allows your health care provider to track your heart activity for several days, if needed.  Stress tests. These can be done through exercise or by taking medicine that makes your heart beat more quickly.  Blood tests.  Other imaging tests.  How is this treated? Treatment depends on what is causing your chest pain. Treatment may include:  Medicines. These may include: ? Acid blockers for heartburn. ? Anti-inflammatory medicine. ? Pain medicine for inflammatory conditions. ? Antibiotic medicine, if an infection is present. ? Medicines to dissolve blood clots. ? Medicines to treat coronary artery disease (CAD).  Supportive care for conditions that do not require medicines. This may include: ? Resting. ? Applying heat or cold packs to injured areas. ? Limiting activities until pain decreases.  Follow these instructions at home: Medicines  If you were prescribed an antibiotic, take it as told by your health care provider. Do not stop taking the antibiotic even if you start to feel better.  Take over-the-counter and prescription medicines only as told by your health care provider. Lifestyle  Do not use any products that contain nicotine or tobacco, such as cigarettes and e-cigarettes. If you need help quitting, ask your health care provider.  Do not drink alcohol.  Make lifestyle changes as directed by your health care provider. These may include: ? Getting regular  exercise. Ask your health care provider to suggest some activities that are safe for you. ? Eating a heart-healthy diet. A registered dietitian can help you to learn healthy eating options. ? Maintaining a healthy weight. ? Managing diabetes, if  necessary. ? Reducing stress, such as with yoga or relaxation techniques. General instructions  Avoid any activities that bring on chest pain.  If heartburn is the cause for your chest pain, raise (elevate) the head of your bed about 6 inches (15 cm) by putting blocks under the legs. Sleeping with more pillows does not effectively relieve heartburn because it only changes the position of your head.  Keep all follow-up visits as told by your health care provider. This is important. This includes any further testing if your chest pain does not go away. Contact a health care provider if:  Your chest pain does not go away.  You have a rash with blisters on your chest.  You have a fever.  You have chills. Get help right away if:  Your chest pain is worse.  You have a cough that gets worse, or you cough up blood.  You have severe pain in your abdomen.  You have severe weakness.  You faint.  You have sudden, unexplained chest discomfort.  You have sudden, unexplained discomfort in your arms, back, neck, or jaw.  You have shortness of breath at any time.  You suddenly start to sweat, or your skin gets clammy.  You feel nauseous or you vomit.  You suddenly feel light-headed or dizzy.  Your heart begins to beat quickly, or it feels like it is skipping beats. These symptoms may represent a serious problem that is an emergency. Do not wait to see if the symptoms will go away. Get medical help right away. Call your local emergency services (911 in the [place] Do not drive yourself to the hospital. This information is not intended to replace advice given to you by your health care provider. Make sure you discuss any questions you have with your health care provider. Document Released: 01/21/2005 Document Revised: 01/06/2016 Document Reviewed: 01/06/2016 Elsevier Interactive Patient Education  2017 [employer].   Steps to Quit Smoking Smoking tobacco can be harmful to your health  and can affect almost every organ in your body. Smoking puts you, and those around you, at risk for developing many serious chronic diseases. Quitting smoking is difficult, but it is one of the best things that you can do for your health. It is never too late to quit. What are the benefits of quitting smoking? When you quit smoking, you lower your risk of developing serious diseases and conditions, such as:  Lung cancer or lung disease, such as COPD.  Heart disease.  Stroke.  Heart attack.  Infertility.  Osteoporosis and bone fractures.  Additionally, symptoms such as coughing, wheezing, and shortness of breath may get better when you quit. You may also find that you get sick less often because your body is stronger at fighting off colds and infections. If you are pregnant, quitting smoking can help to reduce your chances of having a baby of low birth weight. How do I get ready to quit? When you decide to quit smoking, create a plan to make sure that you are successful. Before you quit:  Pick a date to quit. Set a date within the next two weeks to give you time to prepare.  Write down the reasons why you are quitting. Keep this list in places where you  will see it often, such as on your bathroom mirror or in your car or wallet.  Identify the people, places, things, and activities that make you want to smoke (triggers) and avoid them. Make sure to take these actions: ? Throw away all cigarettes at home, at work, and in your car. ? Throw away smoking accessories, such as Set designer. ? Clean your car and make sure to empty the ashtray. ? Clean your home, including curtains and carpets.  Tell your family, friends, and coworkers that you are quitting. Support from your loved ones can make quitting easier.  Talk with your health care provider about your options for quitting smoking.  Find out what treatment options are covered by your health insurance.  What strategies can I  use to quit smoking? Talk with your healthcare provider about different strategies to quit smoking. Some strategies include:  Quitting smoking altogether instead of gradually lessening how much you smoke over a period of time. Research shows that quitting "cold [place]" is more successful than gradually quitting.  Attending in-person counseling to help you build problem-solving skills. You are more likely to have success in quitting if you attend several counseling sessions. Even short sessions of 10 minutes can be effective.  Finding resources and support systems that can help you to quit smoking and remain smoke-free after you quit. These resources are most helpful when you use them often. They can include: ? Online chats with a Veterinary surgeon. ? Telephone quitlines. ? Automotive engineer. ? Support groups or group counseling. ? Text messaging programs. ? Mobile phone applications.  Taking medicines to help you quit smoking. (If you are pregnant or breastfeeding, talk with your health care provider first.) Some medicines contain nicotine and some do not. Both types of medicines help with cravings, but the medicines that include nicotine help to relieve withdrawal symptoms. Your health care provider may recommend: ? Nicotine patches, gum, or lozenges. ? Nicotine inhalers or sprays. ? Non-nicotine medicine that is taken by mouth.  Talk with your health care provider about combining strategies, such as taking medicines while you are also receiving in-person counseling. Using these two strategies together makes you more likely to succeed in quitting than if you used either strategy on its own. If you are pregnant or breastfeeding, talk with your health care provider about finding counseling or other support strategies to quit smoking. Do not take medicine to help you quit smoking unless told to do so by your health care provider. What things can I do to make it easier to quit? Quitting smoking  might feel overwhelming at first, but there is a lot that you can do to make it easier. Take these important actions:  Reach out to your family and friends and ask that they support and encourage you during this time. Call telephone quitlines, reach out to support groups, or work with a counselor for support.  Ask people who smoke to avoid smoking around you.  Avoid places that trigger you to smoke, such as bars, parties, or smoke-break areas at work.  Spend time around people who do not smoke.  Lessen stress in your life, because stress can be a smoking trigger for some people. To lessen stress, try: ? Exercising regularly. ? Deep-breathing exercises. ? Yoga. ? Meditating. ? Performing a body scan. This involves closing your eyes, scanning your body from head to toe, and noticing which parts of your body are particularly tense. Purposefully relax the muscles in those areas.  Download or purchase mobile phone or tablet apps (applications) that can help you stick to your quit plan by providing reminders, tips, and encouragement. There are many free apps, such as QuitGuide from the [employer] developer(Centers for Disease Control and Prevention). You can find other support for quitting smoking (smoking cessation) through smokefree.gov and other websites.  How will I feel when I quit smoking? Within the first 24 hours of quitting smoking, you may start to feel some withdrawal symptoms. These symptoms are usually most noticeable 2-3 days after quitting, but they usually do not last beyond 2-3 weeks. Changes or symptoms that you might experience include:  Mood swings.  Restlessness, anxiety, or irritation.  Difficulty concentrating.  Dizziness.  Strong cravings for sugary foods in addition to nicotine.  Mild weight gain.  Constipation.  Nausea.  Coughing or a sore throat.  Changes in how your medicines work in your body.  A depressed mood.  Difficulty sleeping (insomnia).  After the first  2-3 weeks of quitting, you may start to notice more positive results, such as:  Improved sense of smell and taste.  Decreased coughing and sore throat.  Slower heart rate.  Lower blood pressure.  Clearer skin.  The ability to breathe more easily.  Fewer sick days.  Quitting smoking is very challenging for most people. Do not get discouraged if you are not successful the first time. Some people need to make many attempts to quit before they achieve long-term success. Do your best to stick to your quit plan, and talk with your health care provider if you have any questions or concerns. This information is not intended to replace advice given to you by your health care provider. Make sure you discuss any questions you have with your health care provider. Document Released: 04/07/2001 Document Revised: 12/10/2015 Document Reviewed: 08/28/2014 Elsevier Interactive Patient Education  2018 Elsevier Inc.  Cough, Adult Coughing is a reflex that clears your throat and your airways. Coughing helps to heal and protect your lungs. It is normal to cough occasionally, but a cough that happens with other symptoms or lasts a long time may be a sign of a condition that needs treatment. A cough may last only 2-3 weeks (acute), or it may last longer than 8 weeks (chronic). What are the causes? Coughing is commonly caused by:  Breathing in substances that irritate your lungs.  A viral or bacterial respiratory infection.  Allergies.  Asthma.  Postnasal drip.  Smoking.  Acid backing up from the stomach into the esophagus (gastroesophageal reflux).  Certain medicines.  Chronic lung problems, including COPD (or rarely, lung cancer).  Other medical conditions such as heart failure.  Follow these instructions at home: Pay attention to any changes in your symptoms. Take these actions to help with your discomfort:  Take medicines only as told by your health care provider. ? If you were  prescribed an antibiotic medicine, take it as told by your health care provider. Do not stop taking the antibiotic even if you start to feel better. ? Talk with your health care provider before you take a cough suppressant medicine.  Drink enough fluid to keep your urine clear or pale yellow.  If the air is dry, use a cold steam vaporizer or humidifier in your bedroom or your home to help loosen secretions.  Avoid anything that causes you to cough at work or at home.  If your cough is worse at night, try sleeping in a semi-upright position.  Avoid cigarette smoke. If  you smoke, quit smoking. If you need help quitting, ask your health care provider.  Avoid caffeine.  Avoid alcohol.  Rest as needed.  Contact a health care provider if:  You have new symptoms.  You cough up pus.  Your cough does not get better after 2-3 weeks, or your cough gets worse.  You cannot control your cough with suppressant medicines and you are losing sleep.  You develop pain that is getting worse or pain that is not controlled with pain medicines.  You have a fever.  You have unexplained weight loss.  You have night sweats. Get help right away if:  You cough up blood.  You have difficulty breathing.  Your heartbeat is very fast. This information is not intended to replace advice given to you by your health care provider. Make sure you discuss any questions you have with your health care provider. Document Released: 10/10/2010 Document Revised: 09/19/2015 Document Reviewed: 06/20/2014 Elsevier Interactive Patient Education  2018 [employer].  IF you received an x-ray today, you will receive an invoice from [facility]. Please contact [facility] at [phone] with questions or concerns regarding your invoice.   IF you received labwork today, you will receive an invoice from [facility]. Please contact [facility] at [phone] with questions or concerns regarding your invoice.    Our billing staff will not be able to assist you with questions regarding bills from these companies.  You will be contacted with the lab results as soon as they are available. The fastest way to get your results is to activate your My Chart account. Instructions are located on the last page of this paperwork. If you have not heard from [place] regarding the results in 2 weeks, please contact this office.       I personally performed the services described in this documentation, which was scribed in my presence. The recorded information has been reviewed and considered for accuracy and completeness, addended by me as needed, and agree with information above.  Signed,   Meredith Staggers, MD [facility].  11/06/17 1:20 PM

## 2017-11-09 ENCOUNTER — Ambulatory Visit: Payer: BLUE CROSS/BLUE SHIELD | Admitting: Gastroenterology

## 2017-11-15 ENCOUNTER — Encounter: Payer: Self-pay | Admitting: Family Medicine

## 2018-01-26 ENCOUNTER — Encounter (HOSPITAL_COMMUNITY): Payer: Self-pay | Admitting: Emergency Medicine

## 2018-01-26 ENCOUNTER — Other Ambulatory Visit: Payer: Self-pay

## 2018-01-26 ENCOUNTER — Emergency Department (HOSPITAL_COMMUNITY)
Admission: EM | Admit: 2018-01-26 | Discharge: 2018-01-27 | Discharge status: Against Medical Advice | Payer: BLUE CROSS/BLUE SHIELD | Attending: Emergency Medicine | Admitting: Emergency Medicine

## 2018-01-26 DIAGNOSIS — M79641 Pain in right hand: Secondary | ICD-10-CM | POA: Diagnosis present

## 2018-01-26 DIAGNOSIS — Z5321 Procedure and treatment not carried out due to patient leaving prior to being seen by health care provider: Secondary | ICD-10-CM | POA: Insufficient documentation

## 2018-01-26 NOTE — ED Triage Notes (Signed)
Patient c/o swelling around right middle finger nail x2 weeks. Reports attempting to drain tonight with increasing pain.

## 2018-09-08 ENCOUNTER — Other Ambulatory Visit: Payer: Self-pay | Admitting: Family Medicine

## 2018-09-08 DIAGNOSIS — F418 Other specified anxiety disorders: Secondary | ICD-10-CM

## 2018-09-08 NOTE — Telephone Encounter (Signed)
Patient is requesting a refill of the following medications: Requested Prescriptions   Pending Prescriptions Disp Refills  . sertraline (ZOLOFT) 50 MG tablet [Pharmacy Med Name: SERTRALINE 50MG  TABLETS] 90 tablet 1    Sig: TAKE 1 TABLET(50 MG) BY MOUTH DAILY    Date of patient request: 05/14/20220 Last office visit: 11/06/2017 Date of last refill: 11/06/2017 Last refill amount: #90 with 1 refill Follow up time period per chart: Continue zoloft at same dose for now, but can recheck in next 2-3 months to see if higher dose is needed per 11/06/17 pt instructions.

## 2018-09-08 NOTE — Telephone Encounter (Signed)
Refilled for additional 90 days, but please schedule appointment during that time as last evaluated in July 2019 with plan to follow-up in a few months at that time.

## 2018-10-04 NOTE — Telephone Encounter (Signed)
Schedule appt for 10/07/2018

## 2018-10-07 ENCOUNTER — Ambulatory Visit: Payer: Self-pay | Admitting: Family Medicine

## 2018-10-07 ENCOUNTER — Encounter: Payer: Self-pay | Admitting: Family Medicine

## 2018-10-07 ENCOUNTER — Other Ambulatory Visit: Payer: Self-pay

## 2018-10-07 VITALS — BP 133/79 | HR 75 | Temp 97.6°F | Resp 16 | Wt 252.6 lb

## 2018-10-07 DIAGNOSIS — R0600 Dyspnea, unspecified: Secondary | ICD-10-CM

## 2018-10-07 DIAGNOSIS — R0789 Other chest pain: Secondary | ICD-10-CM

## 2018-10-07 DIAGNOSIS — F418 Other specified anxiety disorders: Secondary | ICD-10-CM

## 2018-10-07 DIAGNOSIS — F1010 Alcohol abuse, uncomplicated: Secondary | ICD-10-CM

## 2018-10-07 DIAGNOSIS — F1210 Cannabis abuse, uncomplicated: Secondary | ICD-10-CM

## 2018-10-07 DIAGNOSIS — R600 Localized edema: Secondary | ICD-10-CM

## 2018-10-07 MED ORDER — SERTRALINE HCL 100 MG PO TABS: 100.0000 mg | ORAL_TABLET | Freq: Every day | ORAL | 3 refills | Status: DC

## 2018-10-07 NOTE — Patient Instructions (Addendum)
Increase Zoloft to 100mg  for now.  Cut back on alcohol to no more than 1-2 drinks at a time and cut back on marijuana as well.  I do recommend meeting with counselor.  Here are other numbers if needed.  [facility]: [place] Phone: [phone]   Cut back on salt/sodium in diet. I will check some tests for the swelling, and shortness of breath and refer you to cardiology (but with the reproducible pain on exam, that is less likely heart related.) Recheck in next 2 weeks.  Return to the clinic or go to the nearest emergency room if any of your symptoms worsen or new symptoms occur.   Nonspecific Chest Pain Chest pain can be caused by many different conditions. Some causes of chest pain can be life-threatening. These will require treatment right away. Serious causes of chest pain include:  Heart attack.  A tear in the body's main blood vessel.  Redness and swelling (inflammation) around your heart.  Blood clot in your lungs. Other causes of chest pain may not be so serious. These include:  Heartburn.  Anxiety or stress.  Damage to bones or muscles in your chest.  Lung infections. Chest pain can feel like:  Pain or discomfort in your chest.  Crushing, pressure, aching, or squeezing pain.  Burning or tingling.  Dull or sharp pain that is worse when you move, cough, or take a deep breath.  Pain or discomfort that is also felt in your back, neck, jaw, shoulder, or arm, or pain that spreads to any of these areas. It is hard to know whether your pain is caused by something that is serious or something that is not so serious. So it is important to see your doctor right away if you have chest pain. Follow these instructions at home: Medicines  Take over-the-counter and prescription medicines only as told by your doctor.  If you were prescribed an antibiotic medicine, take it as told by your doctor. Do not stop taking the  antibiotic even if you start to feel better. Lifestyle   Rest as told by your doctor.  Do not use any products that contain nicotine or tobacco, such as cigarettes, e-cigarettes, and chewing tobacco. If you need help quitting, ask your doctor.  Do not drink alcohol.  Make lifestyle changes as told by your doctor. These may include: ? Getting regular exercise. Ask your doctor what activities are safe for you. ? Eating a heart-healthy diet. A diet and nutrition specialist (dietitian) can help you to learn healthy eating options. ? Staying at a healthy weight. ? Treating diabetes or high blood pressure, if needed. ? Lowering your stress. Activities such as yoga and relaxation techniques can help. General instructions  Pay attention to any changes in your symptoms. Tell your doctor about them or any new symptoms.  Avoid any activities that cause chest pain.  Keep all follow-up visits as told by your doctor. This is important. You may need more testing if your chest pain does not go away. Contact a doctor if:  Your chest pain does not go away.  You feel depressed.  You have a fever. Get help right away if:  Your chest pain is worse.  You have a cough that gets worse, or you cough up blood.  You have very bad (severe) pain in your belly (abdomen).  You pass out (faint).  You have either of these for no clear reason: ? Sudden chest discomfort. ? Sudden discomfort  in your arms, back, neck, or jaw.  You have shortness of breath at any time.  You suddenly start to sweat, or your skin gets clammy.  You feel sick to your stomach (nauseous).  You throw up (vomit).  You suddenly feel lightheaded or dizzy.  You feel very weak or tired.  Your heart starts to beat fast, or it feels like it is skipping beats. These symptoms may be an emergency. Do not wait to see if the symptoms will go away. Get medical help right away. Call your local emergency services (911 in the [place] Do  not drive yourself to the hospital. Summary  Chest pain can be caused by many different conditions. The cause may be serious and need treatment right away. If you have chest pain, see your doctor right away.  Follow your doctor's instructions for taking medicines and making lifestyle changes.  Keep all follow-up visits as told by your doctor. This includes visits for any further testing if your chest pain does not go away.  Be sure to know the signs that show that your condition has become worse. Get help right away if you have these symptoms. This information is not intended to replace advice given to you by your health care provider. Make sure you discuss any questions you have with your health care provider. Document Released: 09/30/2007 Document Revised: 10/14/2017 Document Reviewed: 10/14/2017 Elsevier Interactive Patient Education  2019 Elsevier Inc.   Peripheral Edema  Peripheral edema is swelling that is caused by a buildup of fluid. Peripheral edema most often affects the lower legs, ankles, and feet. It can also develop in the arms, hands, and face. The area of the body that has peripheral edema will look swollen. It may also feel heavy or warm. Your clothes may start to feel tight. Pressing on the area may make a temporary dent in your skin. You may not be able to move your arm or leg as much as usual. There are many causes of peripheral edema. It can be a complication of other diseases, such as congestive heart failure, kidney disease, or a problem with your blood circulation. It also can be a side effect of certain medicines. It often happens to women during pregnancy. Sometimes, the cause is not known. Treating the underlying condition is often the only treatment for peripheral edema. Follow these instructions at home: Pay attention to any changes in your symptoms. Take these actions to help with your discomfort:  Raise (elevate) your legs while you are sitting or lying  down.  Move around often to prevent stiffness and to lessen swelling. Do not sit or stand for long periods of time.  Wear support stockings as told by your health care provider.  Follow instructions from your health care provider about limiting salt (sodium) in your diet. Sometimes eating less salt can reduce swelling.  Take over-the-counter and prescription medicines only as told by your health care provider. Your health care provider may prescribe medicine to help your body get rid of excess water (diuretic).  Keep all follow-up visits as told by your health care provider. This is important. Contact a health care provider if:  You have a fever.  Your edema starts suddenly or is getting worse, especially if you are pregnant or have a medical condition.  You have swelling in only one leg.  You have increased swelling and pain in your legs. Get help right away if:  You develop shortness of breath, especially when you are lying down.  You have pain in your chest or abdomen.  You feel weak.  You faint. This information is not intended to replace advice given to you by your health care provider. Make sure you discuss any questions you have with your health care provider. Document Released: 05/21/2004 Document Revised: 09/16/2015 Document Reviewed: 10/24/2014 Elsevier Interactive Patient Education  [employer]2019 [employer].    If you have lab work done today you will be contacted with your lab results within the next 2 weeks.  If you have not heard from us then please contact us. The fastest way to get your results is to register for My Chart.   IF you received an x-ray today, you will receive an invoice from [facility]. Please contact [facility] at [phone] with questions or concerns regarding your invoice.   IF you received labwork today, you will receive an invoice from [facility]. Please contact [facility] at [phone] with questions or concerns regarding your  invoice.   Our billing staff will not be able to assist you with questions regarding bills from these companies.  You will be contacted with the lab results as soon as they are available. The fastest way to get your results is to activate your My Chart account. Instructions are located on the last page of this paperwork. If you have not heard from us regarding the results in 2 weeks, please contact this office.

## 2018-10-08 LAB — BASIC METABOLIC PANEL
BUN/Creatinine Ratio: 19 (ref 9–20)
BUN: 16 mg/dL (ref 6–20)
CO2: 25 mmol/L (ref 20–29)
Calcium: 8.9 mg/dL (ref 8.7–10.2)
Chloride: 105 mmol/L (ref 96–106)
Creatinine, Ser: 0.83 mg/dL (ref 0.76–1.27)
GFR calc Af Amer: 136 mL/min/{1.73_m2} (ref >59)
GFR calc non Af Amer: 118 mL/min/{1.73_m2} (ref >59)
Glucose: 100 mg/dL — ABNORMAL HIGH (ref 65–99)
Potassium: 4.6 mmol/L (ref 3.5–5.2)
Sodium: 143 mmol/L (ref 134–144)

## 2018-10-08 LAB — CBC
Hematocrit: 47.4 % (ref 37.5–51.0)
Hemoglobin: 16 g/dL (ref 13.0–17.7)
MCH: 31.1 pg (ref 26.6–33.0)
MCHC: 33.8 g/dL (ref 31.5–35.7)
MCV: 92 fL (ref 79–97)
Platelets: 222 10*3/uL (ref 150–450)
RBC: 5.14 x10E6/uL (ref 4.14–5.80)
RDW: 12.9 % (ref 11.6–15.4)
WBC: 9.5 10*3/uL (ref 3.4–10.8)

## 2018-10-08 LAB — PRO B NATRIURETIC PEPTIDE: NT-Pro BNP: 47 pg/mL (ref 0–86)

## 2018-10-08 LAB — TSH: TSH: 3.7 u[IU]/mL (ref 0.450–4.500)

## 2018-10-10 ENCOUNTER — Encounter: Payer: Self-pay | Admitting: Family Medicine

## 2018-10-10 NOTE — Progress Notes (Signed)
Subjective [] Expand by Default  Patient ID: Jose Carr, male    DOB: 04/21/1991, 28 y.o.   MRN: [id number]  HPI  Jose Carr is a 28 y.o. male Presents today for:     Chief Complaint  Patient presents with  . Depression    Patient was last seen here 11/06/2017 for depression and here today to f/u. On last office visit patient was continued on the same dose of zoloft and told to f/u in 2-3 month if need to have dose increased.. Patient stated meds helped a lot at first for the first month then asfter that went down hill from there  . Ankle Pain    Ankle pain, soreness, and edema. Have been working more hours at work may be a part of the pain and swelling. Have been wrapping ankles that kinda of help. Left side is worse   Depression:  [facility] 10/07/2018 10/07/2018 11/06/2017 09/30/2017  Decreased Interest 2 0 0 0  Down, Depressed, Hopeless 1 0 0 0  PHQ - 2 Score 3 0 0 0  Altered sleeping 1 - - -  Tired, decreased energy 0 - - -  Change in appetite 0 - - -  Feeling bad or failure about yourself  0 - - -  Trouble concentrating 1 - - -  Moving slowly or fidgety/restless 0 - - -  Suicidal thoughts 0 - - -  PHQ-9 Score 5 - - -  Difficult doing work/chores Not difficult at all - - -  Last discussed in July 2019.  Was continue Zoloft 50 mg daily at that time.  Advised to follow-up in a few months at that time.  Did recommend he continue to cut back on marijuana use. zoloft tolerated except some difficulty reaching climax at times.   Today - seems like zoloft not working as well. More antsy past few weeks. Some stress with current pandemic, returning to work.  Felt like worsening anxiety last night, overwhelmed. Irritated more quickly.  No recent counseling.  More alcohol - 2-3 nights per week, with up to 5-6 drinks.  No DUI,  Father with alcohol issues.  Still with marijuana use - increased use during pandemic, no other IDU.  Exercise: work, playing with kids. No  other regular exercise. Weight increased during pandemic, trying to eat better recently.   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 Past month back to work. Swells during day - notes at ankles to feet. Sometimes feels dyspneic at night primarily lying down at night.  gets exhausted fast - past month.  No hx of heart disease.  Occasional chest pains - going around to back. Sometimes at work. Past month or so. Notes with activity.  Father with MI in 3150's.  No new calf pain, Sleeps on 2 pillows at night.  There are no active problems to display for this patient.      Past Medical History:  Diagnosis Date  . ADHD (attention deficit hyperactivity disorder)   . Asthma   . COPD (chronic obstructive pulmonary disease) (HCC)   . Substance abuse [facility](HCC)         Past Surgical History:  Procedure Laterality Date  . CARPAL TUNNEL RELEASE    . HAND SURGERY    . HAND SURGERY    . KNEE SURGERY    . KNEE SURGERY     No Known Allergies        Prior to Admission medications  Medication Sig Start Date End Date Taking? Authorizing Provider  albuterol (PROVENTIL HFA;VENTOLIN HFA) 108 (90 Base) MCG/ACT inhaler Inhale 1-2 puffs into the lungs every 6 (six) hours as needed for wheezing or shortness of breath. 09/28/15  Yes Santiago Glad, PA-C  sertraline (ZOLOFT) 50 MG tablet TAKE 1 TABLET(50 MG) BY MOUTH DAILY 09/08/18 10/07/18 Yes Shade Flood, MD   Social History        Socioeconomic History  . Marital status: Single    Spouse name: Not on file  . Number of children: Not on file  . Years of education: Not on file  . Highest education level: Not on file  Occupational History  . Not on file  Social Needs  . Financial resource strain: Not on file  . Food insecurity    Worry: Not on file    Inability: Not on file  . Transportation needs    Medical: Not on file    Non-medical: Not on file  Tobacco Use  . Smoking status:  Current Every Day Smoker    Packs/day: 0.50    Types: Cigarettes  . Smokeless tobacco: Never Used  Substance and Sexual Activity  . Alcohol use: Not Currently  . Drug use: Yes    Types: Marijuana    Comment: daily use  . Sexual activity: Not on file  Lifestyle  . Physical activity    Days per week: Not on file    Minutes per session: Not on file  . Stress: Not on file  Relationships  . Social Musician on phone: Not on file    Gets together: Not on file    Attends religious service: Not on file    Active member of club or organization: Not on file    Attends meetings of clubs or organizations: Not on file    Relationship status: Not on file  . Intimate partner violence    Fear of current or ex partner: Not on file    Emotionally abused: Not on file    Physically abused: Not on file    Forced sexual activity: Not on file  Other Topics Concern  . Not on file  Social History Narrative  . Not on file    Review of Systems As above.  Objective Expand by Default  Physical Exam Vitals signs reviewed.  Constitutional:      Appearance: He is well-developed.  HENT:     Head: Normocephalic and atraumatic.  Eyes:     Pupils: Pupils are equal, round, and reactive to light.  Neck:     Vascular: No carotid bruit or JVD.  Cardiovascular:     Rate and Rhythm: Normal rate and regular rhythm.     Heart sounds: Normal heart sounds. No murmur.     Comments: Some ttp over anterior chest wall - similar to prior pain Pulmonary:     Effort: Pulmonary effort is normal.     Breath sounds: Normal breath sounds. No rales.  Skin:    General: Skin is warm and dry.  Neurological:     Mental Status: He is alert and oriented to person, place, and time.       Vitals:   10/07/18 0829  BP: 133/79  Pulse: 75  Resp: 16  Temp: 97.6 F (36.4 C)  TempSrc: Oral  SpO2: 96%  Weight: 252 lb 9.6 oz (114.6 kg)       Assessment & Plan:    Daxter Paule is a 28 y.o.  male Depression with anxiety - Plan: sertraline (ZOLOFT) 100 MG tablet, DISCONTINUED: sertraline (ZOLOFT) 100 MG tablet Alcohol abuse Marijuana abuse -Decreased control, likely component of situational anxiety. -Increase Zoloft 100 mg daily, counseling discussed with handout on phone numbers provided. -Decrease alcohol and marijuana use. -Recheck 2 weeks.  Dyspnea, unspecified type - Plan: Basic metabolic panel, CBC, Pro b natriuretic peptide, TSH, DG Chest 2 View, Ambulatory referral to Cardiology Chest tightness - Plan: EKG 12-Lead, DG Chest 2 View, Ambulatory referral to Cardiology -Some reproducibility with palpation. suspect chest wall pain, may also be anxiety related.  EKG without acute findings.  Check BNP, BMP and refer to cardiology for further evaluation.  Pedal edema - Plan: TSH -Appears to be more at foot than true lower extremity edema.  Handout given on potential causes, cut back on sodium intake, labs as above, and further evaluation in the next 2 weeks, sooner or ER precautions given if acute worsening   Meds ordered this encounter  Medications  . DISCONTD: sertraline (ZOLOFT) 100 MG tablet    Sig: Take 1 tablet (100 mg total) by mouth daily.    Dispense:  30 tablet    Refill:  3  . sertraline (ZOLOFT) 100 MG tablet    Sig: Take 1 tablet (100 mg total) by mouth daily.    Dispense:  30 tablet    Refill:  3   Patient Instructions    Increase Zoloft to 100mg  for now.  Cut back on alcohol to no more than 1-2 drinks at a time and cut back on marijuana as well.  I do recommend meeting with counselor.  Here are other numbers if needed.  [facility]: [place] Phone: [phone]   Cut back on salt/sodium in diet. I will check some tests for the swelling, and shortness of breath and refer you to cardiology (but with the reproducible pain on exam, that is less likely heart related.)  Recheck in next 2 weeks.  Return to the clinic or go to the nearest emergency room if any of your symptoms worsen or new symptoms occur.   Nonspecific Chest Pain Chest pain can be caused by many different conditions. Some causes of chest pain can be life-threatening. These will require treatment right away. Serious causes of chest pain include:  Heart attack.  A tear in the body's main blood vessel.  Redness and swelling (inflammation) around your heart.  Blood clot in your lungs. Other causes of chest pain may not be so serious. These include:  Heartburn.  Anxiety or stress.  Damage to bones or muscles in your chest.  Lung infections. Chest pain can feel like:  Pain or discomfort in your chest.  Crushing, pressure, aching, or squeezing pain.  Burning or tingling.  Dull or sharp pain that is worse when you move, cough, or take a deep breath.  Pain or discomfort that is also felt in your back, neck, jaw, shoulder, or arm, or pain that spreads to any of these areas. It is hard to know whether your pain is caused by something that is serious or something that is not so serious. So it is important to see your doctor right away if you have chest pain. Follow these instructions at home: Medicines  Take over-the-counter and prescription medicines only as told by your doctor.  If you were prescribed an antibiotic medicine, take it as told by your doctor. Do not stop taking the antibiotic even if you start to feel better.  Lifestyle   Rest as told by your doctor.  Do not use any products that contain nicotine or tobacco, such as cigarettes, e-cigarettes, and chewing tobacco. If you need help quitting, ask your doctor.  Do not drink alcohol.  Make lifestyle changes as told by your doctor. These may include: ? Getting regular exercise. Ask your doctor what activities are safe for you. ? Eating a heart-healthy diet. A diet and nutrition specialist (dietitian) can help you to  learn healthy eating options. ? Staying at a healthy weight. ? Treating diabetes or high blood pressure, if needed. ? Lowering your stress. Activities such as yoga and relaxation techniques can help. General instructions  Pay attention to any changes in your symptoms. Tell your doctor about them or any new symptoms.  Avoid any activities that cause chest pain.  Keep all follow-up visits as told by your doctor. This is important. You may need more testing if your chest pain does not go away. Contact a doctor if:  Your chest pain does not go away.  You feel depressed.  You have a fever. Get help right away if:  Your chest pain is worse.  You have a cough that gets worse, or you cough up blood.  You have very bad (severe) pain in your belly (abdomen).  You pass out (faint).  You have either of these for no clear reason: ? Sudden chest discomfort. ? Sudden discomfort in your arms, back, neck, or jaw.  You have shortness of breath at any time.  You suddenly start to sweat, or your skin gets clammy.  You feel sick to your stomach (nauseous).  You throw up (vomit).  You suddenly feel lightheaded or dizzy.  You feel very weak or tired.  Your heart starts to beat fast, or it feels like it is skipping beats. These symptoms may be an emergency. Do not wait to see if the symptoms will go away. Get medical help right away. Call your local emergency services (911 in the [place] Do not drive yourself to the hospital. Summary  Chest pain can be caused by many different conditions. The cause may be serious and need treatment right away. If you have chest pain, see your doctor right away.  Follow your doctor's instructions for taking medicines and making lifestyle changes.  Keep all follow-up visits as told by your doctor. This includes visits for any further testing if your chest pain does not go away.  Be sure to know the signs that show that your condition has become worse. Get  help right away if you have these symptoms. This information is not intended to replace advice given to you by your health care provider. Make sure you discuss any questions you have with your health care provider. Document Released: 09/30/2007 Document Revised: 10/14/2017 Document Reviewed: 10/14/2017 Elsevier Interactive Patient Education  2019 Elsevier Inc.   Peripheral Edema  Peripheral edema is swelling that is caused by a buildup of fluid. Peripheral edema most often affects the lower legs, ankles, and feet. It can also develop in the arms, hands, and face. The area of the body that has peripheral edema will look swollen. It may also feel heavy or warm. Your clothes may start to feel tight. Pressing on the area may make a temporary dent in your skin. You may not be able to move your arm or leg as much as usual. There are many causes of peripheral edema. It can be a complication of other diseases, such as congestive  heart failure, kidney disease, or a problem with your blood circulation. It also can be a side effect of certain medicines. It often happens to women during pregnancy. Sometimes, the cause is not known. Treating the underlying condition is often the only treatment for peripheral edema. Follow these instructions at home: Pay attention to any changes in your symptoms. Take these actions to help with your discomfort:  Raise (elevate) your legs while you are sitting or lying down.  Move around often to prevent stiffness and to lessen swelling. Do not sit or stand for long periods of time.  Wear support stockings as told by your health care provider.  Follow instructions from your health care provider about limiting salt (sodium) in your diet. Sometimes eating less salt can reduce swelling.  Take over-the-counter and prescription medicines only as told by your health care provider. Your health care provider may prescribe medicine to help your body get rid of excess water (diuretic).   Keep all follow-up visits as told by your health care provider. This is important. Contact a health care provider if:  You have a fever.  Your edema starts suddenly or is getting worse, especially if you are pregnant or have a medical condition.  You have swelling in only one leg.  You have increased swelling and pain in your legs. Get help right away if:  You develop shortness of breath, especially when you are lying down.  You have pain in your chest or abdomen.  You feel weak.  You faint. This information is not intended to replace advice given to you by your health care provider. Make sure you discuss any questions you have with your health care provider. Document Released: 05/21/2004 Document Revised: 09/16/2015 Document Reviewed: 10/24/2014 Elsevier Interactive Patient Education  [employer]2019 Elsevier Inc.    If you have lab work done today you will be contacted with your lab results within the next 2 weeks.  If you have not heard from us then please contact us. The fastest way to get your results is to register for My Chart.   IF you received an x-ray today, you will receive an invoice from [facility]. Please contact [facility] at [phone] with questions or concerns regarding your invoice.   IF you received labwork today, you will receive an invoice from [facility]. Please contact [facility] at [phone] with questions or concerns regarding your invoice.   Our billing staff will not be able to assist you with questions regarding bills from these companies.  You will be contacted with the lab results as soon as they are available. The fastest way to get your results is to activate your My Chart account. Instructions are located on the last page of this paperwork. If you have not heard from us regarding the results in 2 weeks, please contact this office.       Signed,   Meredith StaggersJeffrey Perri Aragones, MD [facility].  10/10/18  8:10 PM

## 2018-10-18 ENCOUNTER — Ambulatory Visit: Payer: Self-pay | Admitting: Family Medicine

## 2018-10-26 ENCOUNTER — Ambulatory Visit: Payer: Self-pay | Admitting: Family Medicine

## 2018-10-27 ENCOUNTER — Telehealth: Payer: Self-pay | Admitting: Family Medicine

## 2018-10-27 NOTE — Telephone Encounter (Addendum)
10/27/2018 - PATIENT HAD AN OFFICE VISIT SCHEDULED WITH DR. Carlota Raspberry ON WED. (10/26/2018). THE OFFICE WAS CLOSED DUE TO PREVENTATIVE DEEP CLEANING. I TRIED TO CALL AND GET HIM SCHEDULED FOR A TELEMED VISIT WITH DR. Carlota Raspberry TODAY BUT HAD TO LEAVE HIM A VOICE MAIL TO RETURN OUR CALL. Teller

## 2018-10-30 IMAGING — DX DG CHEST 2V
2 series · 2 of 2 positions shown · non-contrast
Comparison: Chest x-ray 09/28/2015.

CLINICAL DATA: 27-year-old male with history of left-sided chest
pain.

EXAM:
CHEST - 2 VIEW

[chest pa]
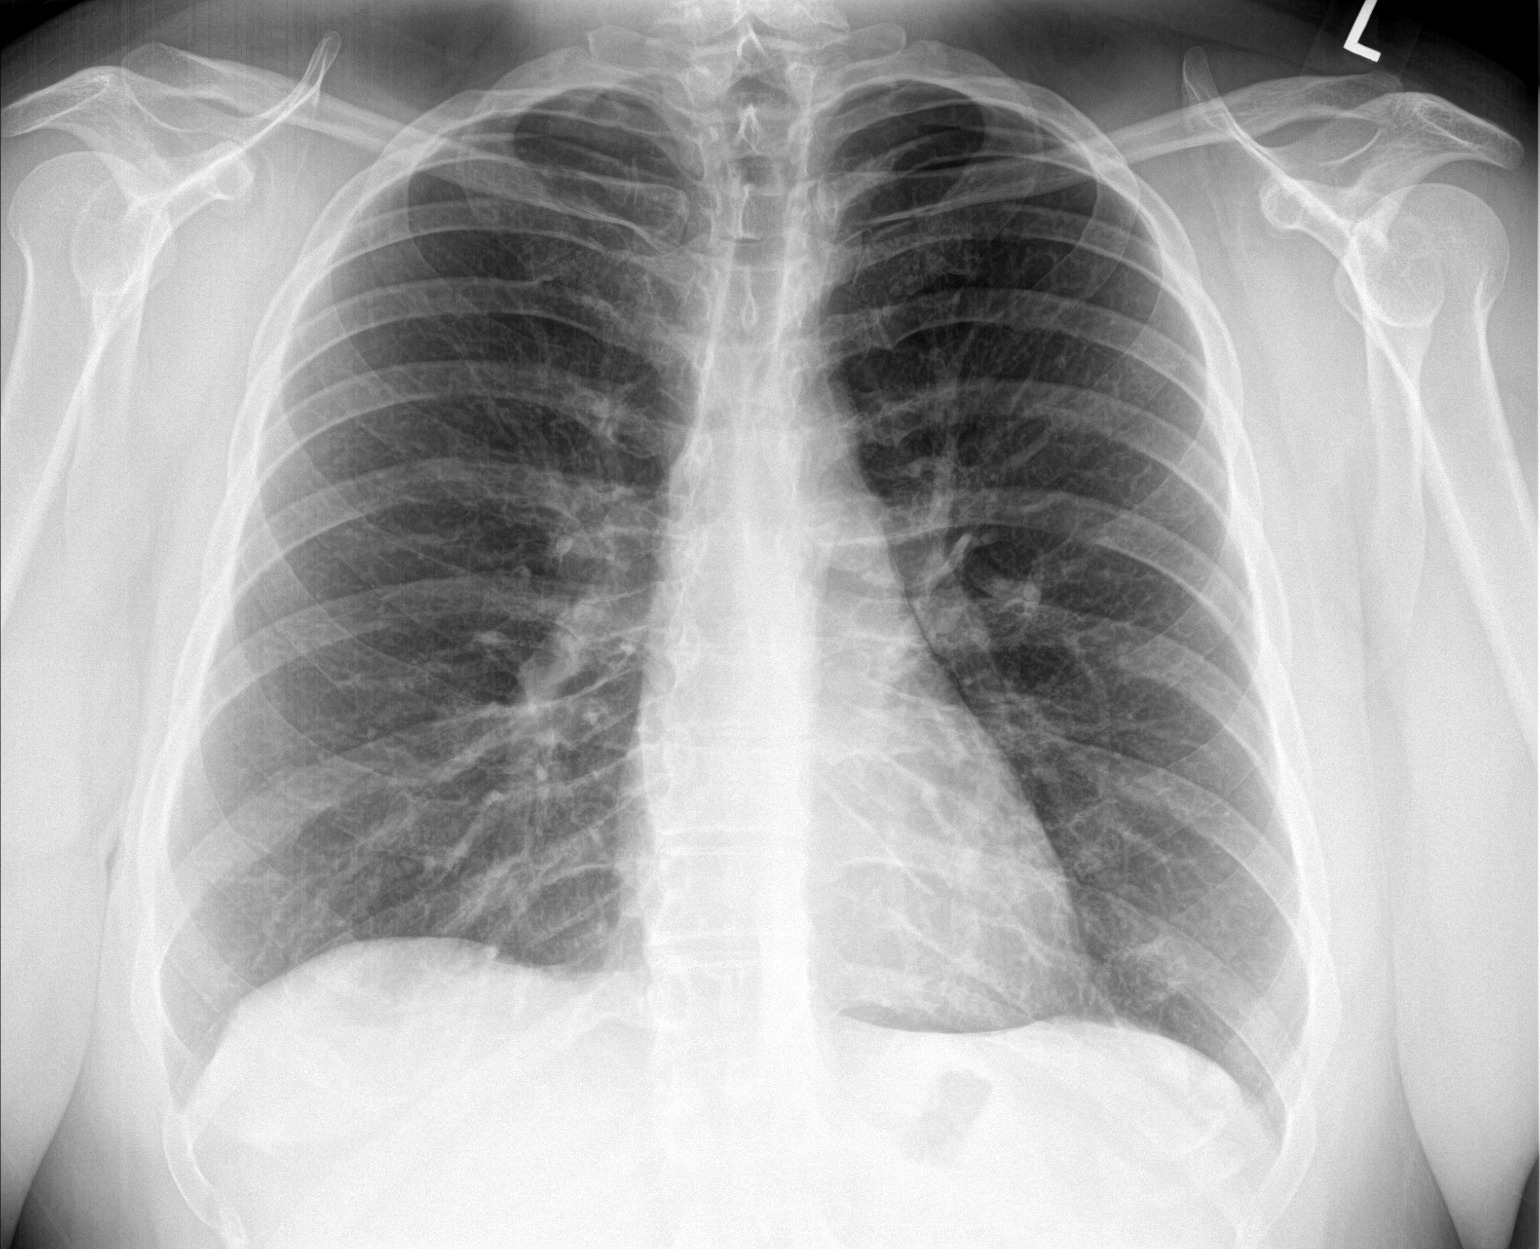

[chest lat]
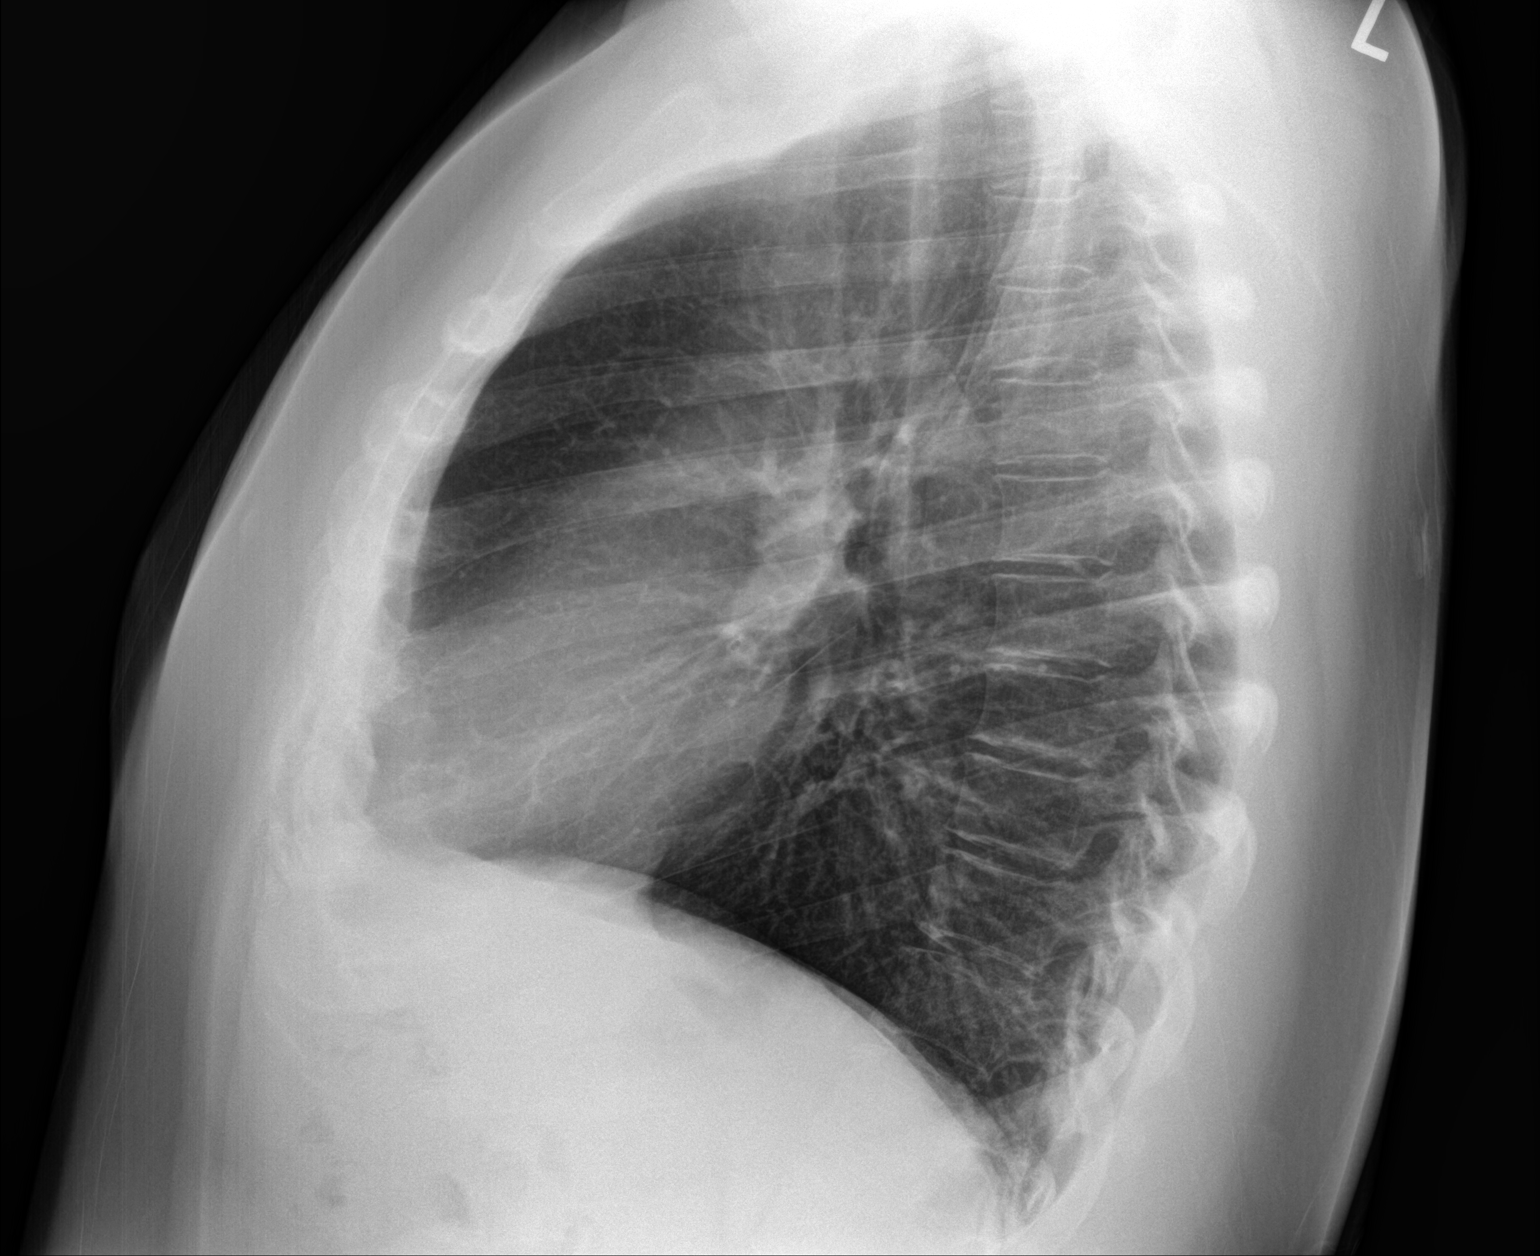

[2 of 2 positions shown; findings below may reference images not displayed]

FINDINGS: Lung volumes are normal. No consolidative airspace disease. No
pleural effusions. No pneumothorax. No pulmonary nodule or mass
noted. Pulmonary vasculature and the cardiomediastinal silhouette
are within normal limits.
IMPRESSION: No radiographic evidence of acute cardiopulmonary disease.

## 2019-04-07 ENCOUNTER — Other Ambulatory Visit: Payer: Self-pay | Admitting: Family Medicine

## 2019-04-07 DIAGNOSIS — F418 Other specified anxiety disorders: Secondary | ICD-10-CM

## 2019-04-07 NOTE — Telephone Encounter (Signed)
Forwarding medication refill request to the clinical pool for review. 

## 2019-05-24 ENCOUNTER — Ambulatory Visit (HOSPITAL_COMMUNITY)
Admission: RE | Admit: 2019-05-24 | Discharge: 2019-05-24 | Disposition: A | Discharge status: Home / Self Care | Payer: Self-pay | Attending: Psychiatry | Admitting: Psychiatry

## 2019-05-24 ENCOUNTER — Encounter (HOSPITAL_COMMUNITY): Payer: Self-pay | Admitting: Psychiatry

## 2019-05-24 DIAGNOSIS — F4320 Adjustment disorder, unspecified: Secondary | ICD-10-CM | POA: Insufficient documentation

## 2019-05-24 DIAGNOSIS — F329 Major depressive disorder, single episode, unspecified: Secondary | ICD-10-CM | POA: Insufficient documentation

## 2019-05-24 DIAGNOSIS — F17210 Nicotine dependence, cigarettes, uncomplicated: Secondary | ICD-10-CM | POA: Insufficient documentation
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
## 2020-07-16 ENCOUNTER — Other Ambulatory Visit: Payer: Self-pay

## 2020-07-16 ENCOUNTER — Encounter (HOSPITAL_COMMUNITY): Payer: Self-pay | Admitting: *Deleted

## 2020-07-16 ENCOUNTER — Emergency Department (HOSPITAL_COMMUNITY): Admission: EM | Admit: 2020-07-16 | Discharge: 2020-07-16 | Discharge status: Against Medical Advice | Payer: Self-pay

## 2020-07-16 ENCOUNTER — Ambulatory Visit (HOSPITAL_COMMUNITY)
Admission: EM | Admit: 2020-07-16 | Discharge: 2020-07-16 | Disposition: A | Discharge status: Other Acute Inpatient Hospital | Payer: Self-pay | Attending: Family Medicine | Admitting: Family Medicine

## 2020-07-16 DIAGNOSIS — F1910 Other psychoactive substance abuse, uncomplicated: Secondary | ICD-10-CM

## 2020-07-16 DIAGNOSIS — F419 Anxiety disorder, unspecified: Secondary | ICD-10-CM | POA: Insufficient documentation

## 2020-07-16 DIAGNOSIS — Z733 Stress, not elsewhere classified: Secondary | ICD-10-CM | POA: Insufficient documentation

## 2020-07-16 DIAGNOSIS — Z20822 Contact with and (suspected) exposure to covid-19: Secondary | ICD-10-CM | POA: Insufficient documentation

## 2020-07-16 DIAGNOSIS — F17210 Nicotine dependence, cigarettes, uncomplicated: Secondary | ICD-10-CM | POA: Insufficient documentation

## 2020-07-16 DIAGNOSIS — R45851 Suicidal ideations: Secondary | ICD-10-CM

## 2020-07-16 DIAGNOSIS — F418 Other specified anxiety disorders: Secondary | ICD-10-CM

## 2020-07-16 DIAGNOSIS — R079 Chest pain, unspecified: Secondary | ICD-10-CM

## 2020-07-16 LAB — SARS CORONAVIRUS 2 (TAT 6-24 HRS): SARS Coronavirus 2: NEGATIVE

## 2020-07-16 NOTE — ED Provider Notes (Signed)
[facility]   [id number] 07/16/20 Arrival Time: 1149  ASSESSMENT & PLAN:  1. Chest pain, unspecified type   2. Substance abuse (HCC)   3. Suicidal thoughts   4. Situational anxiety     To ED for CP workup. Does not have a plan to commit suicide but does desire help. Stable upon discharge to ED via EMS.  ECG: Performed today and interpreted by me: normal EKG, normal sinus rhythm. No STEMI.  COVID testing collected before discharge. Reviewed expectations re: course of current medical issues. Questions answered. Outlined signs and symptoms indicating need for more acute intervention. Patient verbalized understanding. After Visit Summary given.   SUBJECTIVE:  History from: patient.  Jose Carr is a 30 y.o. male who presents with complaint of intermittent chest pain described as pressure; mostly L-sided when present; may last a min or two with resolution. Some SOB with pain. Without associated n/v/diaphoresis/abdominal pain. Much anxiety lately; does not prefer to elaborate. Reports "thinking about harming myself this morning". No suicidal plan at this time. No h/o suicidal attempts. Currently with CP here.  Reports cocaine use two weeks ago; heavy alcohol and THC use over this week; non-prescribed Adderall use in addition with worsening of anxiety.  Denies: fatigue, irregular heart beat, lower extremity edema, near-syncope, orthopnea, paroxysmal nocturnal dyspnea and syncope. No recent illnesses.   Social History   Tobacco Use  Smoking Status Current Every Day Smoker  . Packs/day: 0.50  . Types: Cigarettes  Smokeless Tobacco Never Used    OBJECTIVE:  Vitals:   07/16/20 1202  BP: 109/68  Pulse: 96  Resp: 20  Temp: 98.3 F (36.8 C)  TempSrc: Oral  SpO2: 97%    General appearance: alert, oriented, no acute distress Eyes: PERRLA; EOMI; conjunctivae normal HENT: normocephalic; atraumatic Neck: supple with FROM Lungs: without labored respirations;  speaks full sentences without difficulty; CTAB Heart: regular rate and rhythm Chest Wall: without tenderness to palpation Abdomen: soft, non-tender; no guarding or rebound tenderness Extremities: without edema; without calf swelling or tenderness; symmetrical without gross deformities Skin: warm and dry; without rash or lesions Neuro: normal gait Psychological: alert and cooperative; normal mood and affect   No Known Allergies  Past Medical History:  Diagnosis Date  . ADHD (attention deficit hyperactivity disorder)   . Asthma   . COPD (chronic obstructive pulmonary disease) (HCC)   . Substance abuse (HCC)    Social History   Socioeconomic History  . Marital status: Legally Separated    Spouse name: Not on file  . Number of children: Not on file  . Years of education: Not on file  . Highest education level: Not on file  Occupational History  . Not on file  Tobacco Use  . Smoking status: Current Every Day Smoker    Packs/day: 0.50    Types: Cigarettes  . Smokeless tobacco: Never Used  Vaping Use  . Vaping Use: Never used  Substance and Sexual Activity  . Alcohol use: Not Currently    Comment: Hasn't used in three weeks  . Drug use: Yes    Types: Marijuana    Comment: daily use  . Sexual activity: Not Currently  Other Topics Concern  . Not on file  Social History Narrative   Pt currently lives with sister and brother in law.  Does not have an outpatient psychiatric provider.   Social Determinants of Health   Financial Resource Strain: Not on file  Food Insecurity: Not on file  Transportation Needs: Not on  file  Physical Activity: Not on file  Stress: Not on file  Social Connections: Not on file  Intimate Partner Violence: Not on file   Family History  Problem Relation Age of Onset  . Hypertension Father    Past Surgical History:  Procedure Laterality Date  . CARPAL TUNNEL RELEASE    . HAND SURGERY    . HAND SURGERY    . KNEE SURGERY    . KNEE SURGERY        Mardella Layman, MD 07/16/20 1226

## 2020-07-16 NOTE — ED Triage Notes (Signed)
COVID swab at [facility] . Pt reports he was positive 2 months ago.

## 2020-07-16 NOTE — ED Notes (Signed)
Unable to locate in lobby for triage.

## 2020-07-16 NOTE — ED Triage Notes (Signed)
Pt reports CP and has been self medicating with Cocaine, alcohol and mushroom ,Aderall . Pt does  take Aderall pt takes 1/4 tablet 30mg  with out a RX. Last alcohol last night  4 - 4 locoas . 2 beers ( 24oz). Last used cocaine 2 weeks ago. Pt smoke MJ today nothing else.  CP for for a very long time unsure how long chest pain radiates to back and RT chest.

## 2020-11-27 ENCOUNTER — Ambulatory Visit: Payer: Self-pay | Admitting: Physician Assistant

## 2020-12-19 ENCOUNTER — Other Ambulatory Visit: Payer: Self-pay

## 2020-12-19 ENCOUNTER — Encounter: Payer: Self-pay | Admitting: Physician Assistant

## 2020-12-19 ENCOUNTER — Ambulatory Visit (INDEPENDENT_AMBULATORY_CARE_PROVIDER_SITE_OTHER): Payer: BC Managed Care – PPO | Admitting: Physician Assistant

## 2020-12-19 VITALS — BP 120/76 | HR 130 | Temp 98.3°F | Ht 67.0 in | Wt 229.0 lb

## 2020-12-19 DIAGNOSIS — F419 Anxiety disorder, unspecified: Secondary | ICD-10-CM

## 2020-12-19 DIAGNOSIS — F1010 Alcohol abuse, uncomplicated: Secondary | ICD-10-CM | POA: Diagnosis not present

## 2020-12-19 DIAGNOSIS — F32A Depression, unspecified: Secondary | ICD-10-CM | POA: Diagnosis not present

## 2020-12-19 DIAGNOSIS — F1210 Cannabis abuse, uncomplicated: Secondary | ICD-10-CM

## 2020-12-19 DIAGNOSIS — F17210 Nicotine dependence, cigarettes, uncomplicated: Secondary | ICD-10-CM

## 2020-12-19 DIAGNOSIS — R454 Irritability and anger: Secondary | ICD-10-CM

## 2020-12-19 MED ORDER — BUPROPION HCL ER (SR) 150 MG PO TB12: 150.0000 mg | ORAL_TABLET | Freq: Two times a day (BID) | ORAL | 0 refills | Status: DC

## 2020-12-19 NOTE — Progress Notes (Signed)
New Patient Office Visit  Subjective:  Patient ID: Jose Carr, male    DOB: 29-Dec-1990  Age: 30 y.o. MRN: [id number]  CC:  Chief Complaint  Patient presents with   New Patient (Initial Visit)   Depression    "Manic depression w/ anxiety" Possibly bipolar      HPI Jose Carr presents for new patient establishment.  He admits that he is very concerned about his mental health and is ready to make some changes.  His mom and his wife, who is currently separated from, have been encouraging him to seek help. He says it has been difficult to find primary care and mental health services during the COVID-19 pandemic.  Says 07/16/20 was the height of his anxiety & he had suicidal ideations at that time. He presented to the ED and says that he basically just sat there and did not have any f/up from there. States he has never been treated as inpatient previously. He has never had a formal evaluation from psychiatry.   Has been using marijuana since age 39 and says he drinks heavily as well. Smokes about 2 PPD of cigarettes.   Father was schizophrenic / bipolar and died at age 34. Sister with similar issues & his mother is currently caring for her children. He says he is closest to his mom and oldest sister.   Works third shift. He has not been sleeping well.   No suicidal thoughts currently. Never had any attempts. Admits to anger issues. Currently separated from his wife. He wants to make changes for himself and their children.   Past Medical History:  Diagnosis Date   ADHD (attention deficit hyperactivity disorder)    Anxiety    Asthma    COPD (chronic obstructive pulmonary disease) (HCC)    Depression    Substance abuse (HCC)     Past Surgical History:  Procedure Laterality Date   CARPAL TUNNEL RELEASE     HAND SURGERY     HAND SURGERY     KNEE SURGERY     KNEE SURGERY      Family History  Problem Relation Age of Onset   COPD Father    Hypertension Father    Bipolar  disorder Father    Schizophrenia Father    Bipolar disorder Sister    COPD Paternal Grandmother    COPD Paternal Grandfather     Social History   Socioeconomic History   Marital status: Legally Separated    Spouse name: Not on file   Number of children: Not on file   Years of education: Not on file   Highest education level: Not on file  Occupational History   Not on file  Tobacco Use   Smoking status: Every Day    Packs/day: 2.00    Types: Cigarettes   Smokeless tobacco: Never  Vaping Use   Vaping Use: Some days  Substance and Sexual Activity   Alcohol use: Yes    Alcohol/week: 14.0 standard drinks    Types: 14 Cans of beer per week    Comment: 2 tall boys after work each morning   Drug use: Yes    Types: Marijuana    Comment: daily use   Sexual activity: Yes    Birth control/protection: Other-see comments  Other Topics Concern   Not on file  Social History Narrative   Pt currently lives with sister and brother in Social worker.  Does not have an outpatient psychiatric provider.   Social Determinants of Health  Financial Resource Strain: Not on file  Food Insecurity: Not on file  Transportation Needs: Not on file  Physical Activity: Not on file  Stress: Not on file  Social Connections: Not on file  Intimate Partner Violence: Not on file    ROS Review of Systems REFER TO HPI FOR PERTINENT POSITIVES AND NEGATIVES  Objective:   Today's Vitals: BP 120/76   Pulse (!) 130   Temp 98.3 F (36.8 C) (Temporal)   Ht 5\' 7"  (1.702 m)   Wt 229 lb (103.9 kg)   SpO2 96%   BMI 35.87 kg/m   Physical Exam Vitals and nursing note reviewed.  Constitutional:      General: He is not in acute distress.    Appearance: Normal appearance. He is not toxic-appearing.     Comments: Appears tired, but motivated  HENT:     Head: Normocephalic and atraumatic.     Right Ear: Ear canal and external ear normal.     Left Ear: Ear canal and external ear normal.     Nose: Nose normal.      Mouth/Throat:     Mouth: Mucous membranes are moist.     Pharynx: Oropharynx is clear.  Eyes:     Extraocular Movements: Extraocular movements intact.     Conjunctiva/sclera: Conjunctivae normal.     Pupils: Pupils are equal, round, and reactive to light.  Cardiovascular:     Rate and Rhythm: Normal rate and regular rhythm.     Pulses: Normal pulses.     Heart sounds: Normal heart sounds.  Pulmonary:     Effort: Pulmonary effort is normal.     Breath sounds: Normal breath sounds.  Abdominal:     Tenderness: There is no abdominal tenderness.  Musculoskeletal:        General: Normal range of motion.     Cervical back: Normal range of motion and neck supple.  Skin:    General: Skin is warm and dry.  Neurological:     General: No focal deficit present.     Mental Status: He is alert and oriented to person, place, and time.  Psychiatric:        Mood and Affect: Mood normal.        Behavior: Behavior normal.    Assessment & Plan:   Problem List Items Addressed This Visit   None Visit Diagnoses     Anxiety and depression    -  Primary   Relevant Medications   buPROPion (WELLBUTRIN SR) 150 MG 12 hr tablet   Other Relevant Orders   Ambulatory referral to Psychiatry   Irritability and anger       Relevant Orders   Ambulatory referral to Psychiatry   Alcohol abuse       Relevant Orders   Ambulatory referral to Psychiatry   Marijuana abuse       Relevant Orders   Ambulatory referral to Psychiatry   Cigarette nicotine dependence without complication           Outpatient Encounter Medications as of 12/19/2020  Medication Sig   albuterol (PROVENTIL HFA;VENTOLIN HFA) 108 (90 Base) MCG/ACT inhaler Inhale 1-2 puffs into the lungs every 6 (six) hours as needed for wheezing or shortness of breath.   buPROPion (WELLBUTRIN SR) 150 MG 12 hr tablet Take 1 tablet (150 mg total) by mouth 2 (two) times daily.   sertraline (ZOLOFT) 100 MG tablet TAKE 1 TABLET BY MOUTH DAILY (Patient  not taking: Reported on 12/19/2020)  No facility-administered encounter medications on file as of 12/19/2020.   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 No SI or HI. Urgent referral to psychiatry for help with these issues. Urged him to quit all substance use, but understand that this will be a process. He will look into AA programs. He understands that I will not manage his behavioral health and agrees he needs formal assessment and intense counseling too. However, this may take several months to get an appointment. Therefore, in that time I will have close follow up with him. He did not do well on Zoloft previously. I will start him on Wellbutrin SR 150 mg BID at this time. Possible side effects discussed with him, including possibility for suicidal ideations. He is to call right away should he have any of these changes develop. I think this will be a good medication for him to help with his anger and irritability, as well as reduce cravings.   Follow-up: Return in about 4 weeks (around 01/16/2021) for recheck with Sharri Loya .   Markale Birdsell M Maribeth Jiles, PA-C

## 2020-12-19 NOTE — Patient Instructions (Signed)
Great to meet you today, Jose Carr. I have sent a referral for behavioral health for additional help. I want you to look into some places to help with the alcohol addiction. Start on Wellbutrin once daily for one week and then increase to twice daily if tolerating well. I would like to see you back in about 4 weeks. Call sooner if any concerns.

## 2021-01-16 ENCOUNTER — Other Ambulatory Visit: Payer: Self-pay

## 2021-01-16 ENCOUNTER — Ambulatory Visit (INDEPENDENT_AMBULATORY_CARE_PROVIDER_SITE_OTHER): Payer: BC Managed Care – PPO | Admitting: Physician Assistant

## 2021-01-16 VITALS — BP 112/73 | HR 86 | Temp 97.7°F | Ht 67.0 in | Wt 229.4 lb

## 2021-01-16 DIAGNOSIS — F419 Anxiety disorder, unspecified: Secondary | ICD-10-CM

## 2021-01-16 DIAGNOSIS — F32A Depression, unspecified: Secondary | ICD-10-CM | POA: Diagnosis not present

## 2021-01-16 DIAGNOSIS — R454 Irritability and anger: Secondary | ICD-10-CM | POA: Diagnosis not present

## 2021-01-16 MED ORDER — BUPROPION HCL ER (SR) 150 MG PO TB12: 150.0000 mg | ORAL_TABLET | Freq: Two times a day (BID) | ORAL | 0 refills | Status: DC

## 2021-01-16 NOTE — Patient Instructions (Addendum)
Glad you are doing better!! Please call Christiane Ha at [phone] to schedule appt with Behavioral Health Wellbutrin refilled today See you back in 3 months with me, sooner if any concerns

## 2021-01-16 NOTE — Progress Notes (Signed)
Subjective:    Patient ID: Jose Carr, male    DOB: March 16, 1991, 30 y.o.   MRN: [id number]  Chief Complaint  Patient presents with   Follow-up    HPI Patient is in today for f/up from 12/19/20. He started on Wellbutrin SR 150 mg BID and states he has noticed a dramatic difference in the last month. He and his wife are living together again and working things out in their marriage. He feels less irritable. His depression feels greatly improved. He is motivated to keep working on his mental health and is happy with results so far. He states he received a call from referral, but has not called them back yet.   Past Medical History:  Diagnosis Date   ADHD (attention deficit hyperactivity disorder)    Anxiety    Asthma    COPD (chronic obstructive pulmonary disease) (HCC)    Depression    Substance abuse (HCC)     Past Surgical History:  Procedure Laterality Date   CARPAL TUNNEL RELEASE     HAND SURGERY     HAND SURGERY     KNEE SURGERY     KNEE SURGERY      Family History  Problem Relation Age of Onset   COPD Father    Hypertension Father    Bipolar disorder Father    Schizophrenia Father    Bipolar disorder Sister    COPD Paternal Grandmother    COPD Paternal Grandfather     Social History   Tobacco Use   Smoking status: Every Day    Packs/day: 2.00    Types: Cigarettes   Smokeless tobacco: Never  Vaping Use   Vaping Use: Some days  Substance Use Topics   Alcohol use: Yes    Alcohol/week: 14.0 standard drinks    Types: 14 Cans of beer per week    Comment: 2 tall boys after work each morning   Drug use: Yes    Types: Marijuana    Comment: daily use     No Known Allergies  Review of Systems REFER TO HPI FOR PERTINENT POSITIVES AND NEGATIVES      Objective:     BP 112/73   Pulse 86   Temp 97.7 F (36.5 C)   Ht 5\' 7"  (1.702 m)   Wt 229 lb 6.1 oz (104 kg)   SpO2 96%   BMI 35.93 kg/m   Wt Readings from Last 3 Encounters:  01/16/21 229 lb 6.1  oz (104 kg)  12/19/20 229 lb (103.9 kg)  10/07/18 252 lb 9.6 oz (114.6 kg)    BP Readings from Last 3 Encounters:  01/16/21 112/73  12/19/20 120/76  07/16/20 109/68     Physical Exam Vitals and nursing note reviewed.  Constitutional:      Appearance: Normal appearance.  Neurological:     Mental Status: He is alert.  Psychiatric:        Mood and Affect: Mood normal.        Behavior: Behavior normal.        Thought Content: Thought content normal.        Judgment: Judgment normal.       Assessment & Plan:   Problem List Items Addressed This Visit   None   1. Anxiety and depression 2. Irritability and anger -Very proud of him. Encouraged him to continue on this path. Wellbutrin seems to be helping. Refilled today. Also had him call behavioral health while in office today to try to  set up an appointment.  GAD 7 : Generalized Anxiety Score 01/16/2021 12/19/2020  Nervous, Anxious, on Edge 0 2  Control/stop worrying 0 3  Worry too much - different things 0 3  Trouble relaxing 1 3  Restless 0 3  Easily annoyed or irritable 2 3  Afraid - awful might happen 0 2  Total GAD 7 Score 3 19  Anxiety Difficulty Not difficult at all Extremely difficult    This note was prepared with assistance of Dragon voice recognition software. Occasional wrong-word or sound-a-like substitutions may have occurred due to the inherent limitations of voice recognition software.  Time Spent: 28 minutes of total time was spent on the date of the encounter performing the following actions: chart review prior to seeing the patient, obtaining history, performing a medically necessary exam, counseling on the treatment plan, placing orders, and documenting in our EHR.    Deborahann Poteat M Delvina Mizzell, PA-C

## 2021-01-28 ENCOUNTER — Telehealth: Payer: Self-pay

## 2021-01-28 NOTE — Telephone Encounter (Signed)
Rx sent in 01/16/21

## 2021-02-13 ENCOUNTER — Other Ambulatory Visit: Payer: Self-pay

## 2021-02-13 MED ORDER — BUPROPION HCL ER (SR) 150 MG PO TB12: 150.0000 mg | ORAL_TABLET | Freq: Two times a day (BID) | ORAL | 0 refills | Status: DC

## 2021-02-25 ENCOUNTER — Ambulatory Visit: Payer: BC Managed Care – PPO | Admitting: Psychologist

## 2021-03-28 ENCOUNTER — Other Ambulatory Visit: Payer: Self-pay | Admitting: Physician Assistant

## 2021-04-29 ENCOUNTER — Ambulatory Visit: Payer: BC Managed Care – PPO | Admitting: Physician Assistant

## 2021-05-01 ENCOUNTER — Other Ambulatory Visit: Payer: Self-pay

## 2021-05-01 ENCOUNTER — Ambulatory Visit (INDEPENDENT_AMBULATORY_CARE_PROVIDER_SITE_OTHER): Payer: BC Managed Care – PPO | Admitting: Physician Assistant

## 2021-05-01 VITALS — BP 123/84 | HR 93 | Temp 98.6°F | Ht 67.0 in | Wt 237.2 lb

## 2021-05-01 DIAGNOSIS — R454 Irritability and anger: Secondary | ICD-10-CM | POA: Diagnosis not present

## 2021-05-01 DIAGNOSIS — F1210 Cannabis abuse, uncomplicated: Secondary | ICD-10-CM

## 2021-05-01 DIAGNOSIS — M778 Other enthesopathies, not elsewhere classified: Secondary | ICD-10-CM

## 2021-05-01 DIAGNOSIS — F419 Anxiety disorder, unspecified: Secondary | ICD-10-CM

## 2021-05-01 DIAGNOSIS — F32A Depression, unspecified: Secondary | ICD-10-CM

## 2021-05-01 MED ORDER — NAPROXEN 500 MG PO TABS: ORAL_TABLET | ORAL | 0 refills | Status: DC

## 2021-05-01 MED ORDER — BUPROPION HCL ER (SR) 150 MG PO TB12: ORAL_TABLET | ORAL | 0 refills | Status: DC

## 2021-05-01 NOTE — Patient Instructions (Signed)
Thanks for coming in. Things are continuing to look better - proud of you! Still some things to keep working on, but this is a daily goal. Would love to see you off all substances / alcohol. Counseling could help with this, let me know.   I think good sleep, nutrition, daily walks are all very helpful for stability in moods as well.  Naproxen as needed for flare-ups of tendonitis. Let me know if any pains become persistent.

## 2021-05-01 NOTE — Progress Notes (Signed)
Subjective:    Patient ID: Jose Carr, male    DOB: May 03, 1990, 31 y.o.   MRN: [id number]  Chief Complaint  Patient presents with   Follow-up    HPI Patient is in today for f/up from 01/16/21.  Patient continues to take Wellbutrin 150 mg twice daily.  He states that he continues to feel much better than his initial visit with me.  He still has days where he feels irritable or depressed, but states these are usually after a several day stretch of working third shift and not sleeping much.  He also reports that he is still smoking marijuana most of the day throughout the day and has been since age 81.  He states that he does not smoke it to get high, but feels like without it he is much more irritable.  He admits that he would like to not be dependent on anything and knows that smoking marijuana is probably doing more harm than good for him.  He did not follow through with an appointment with behavioral health, but his wife is encouraging him to do so still.  He says that he is nervous about talking about his past and feels like bringing it up will do more harm than good.  Denies any SI or HI.  Also complains of left shoulder tendinitis.  Says this usually flares up after a long stretch of work.  He does a lot of overhead work.  No weakness or numbness.  Aleve usually helps.  He does not feel like he needs to see a specialist or PT for it at this point.   Past Medical History:  Diagnosis Date   ADHD (attention deficit hyperactivity disorder)    Anxiety    Asthma    COPD (chronic obstructive pulmonary disease) (HCC)    Depression    Substance abuse (HCC)     Past Surgical History:  Procedure Laterality Date   CARPAL TUNNEL RELEASE     HAND SURGERY     HAND SURGERY     KNEE SURGERY     KNEE SURGERY      Family History  Problem Relation Age of Onset   COPD Father    Hypertension Father    Bipolar disorder Father    Schizophrenia Father    Bipolar disorder Sister    COPD Paternal  Grandmother    COPD Paternal Grandfather     Social History   Tobacco Use   Smoking status: Every Day    Packs/day: 2.00    Types: Cigarettes   Smokeless tobacco: Never  Vaping Use   Vaping Use: Some days  Substance Use Topics   Alcohol use: Yes    Alcohol/week: 14.0 standard drinks    Types: 14 Cans of beer per week    Comment: 2 tall boys after work each morning   Drug use: Yes    Types: Marijuana    Comment: daily use     No Known Allergies  Review of Systems NEGATIVE UNLESS OTHERWISE INDICATED IN HPI      Objective:     BP 123/84    Pulse 93    Temp 98.6 F (37 C)    Ht 5\' 7"  (1.702 m)    Wt 237 lb 3.2 oz (107.6 kg)    SpO2 95%    BMI 37.15 kg/m   Wt Readings from Last 3 Encounters:  05/01/21 237 lb 3.2 oz (107.6 kg)  01/16/21 229 lb 6.1 oz (104 kg)  12/19/20 229 lb (103.9 kg)    BP Readings from Last 3 Encounters:  05/01/21 123/84  01/16/21 112/73  12/19/20 120/76     Physical Exam Vitals and nursing note reviewed.  Constitutional:      Appearance: Normal appearance.  Cardiovascular:     Rate and Rhythm: Normal rate and regular rhythm.  Pulmonary:     Effort: Pulmonary effort is normal.  Neurological:     Mental Status: He is alert.  Psychiatric:        Mood and Affect: Mood normal.        Behavior: Behavior normal.        Thought Content: Thought content normal.        Judgment: Judgment normal.       Assessment & Plan:   Problem List Items Addressed This Visit   None Visit Diagnoses     Anxiety and depression    -  Primary   Relevant Medications   buPROPion (WELLBUTRIN SR) 150 MG 12 hr tablet   Irritability and anger       Left shoulder tendonitis       Marijuana abuse            Meds ordered this encounter  Medications   naproxen (NAPROSYN) 500 MG tablet    Sig: Take twice daily with food as needed for inflammatory pain.    Dispense:  60 tablet    Refill:  0   buPROPion (WELLBUTRIN SR) 150 MG 12 hr tablet    Sig: TAKE  1 TABLET(150 MG) BY MOUTH TWICE DAILY    Dispense:  180 tablet    Refill:  0   1. Anxiety and depression 2. Irritability and anger PHQ9 SCORE ONLY 05/01/2021 12/19/2020 10/07/2018  PHQ-9 Total Score 3 26 5    Wellbutrin SR 150 mg twice daily continues to be an incredible medication for managing his diagnoses.  Significant improvement in overall quality of life since starting this medication.  He admits he still has some things he needs to work through and talk through and he is considering counseling.  I think he could benefit from counseling, even if he just approached it from a standpoint of helping him to reduce his addictions towards marijuana, cigarettes, alcohol.  He will let me know.  In the meantime, I am happy to continue working with him and plan to have close follow-up in the next few months to keep him accountable.    3. Left shoulder tendonitis Prescribed naproxen for him to take as needed for flareups of his tendinitis.  NSAID precaution discussed with him.  He knows to take it with food and for no longer than 7 to 10 days at one time.  He will let me know if pain becomes more bothersome or persistent.  4. Marijuana abuse Certainly contributing to #1.  He is very interested in trying to get off this at some point.  Recommended substance use counseling and he is considering it.   This note was prepared with assistance of . Occasional wrong-word or sound-a-like substitutions may have occurred due to the inherent limitations of voice recognition software.  Time Spent: 36 minutes of total time was spent on the date of the encounter performing the following actions: chart review prior to seeing the patient, obtaining history, performing a medically necessary exam, counseling on the treatment plan, placing orders, and documenting in our EHR.    Leonidas Boateng M Danelle Curiale, PA-C

## 2021-05-18 DIAGNOSIS — F1090 Alcohol use, unspecified, uncomplicated: Secondary | ICD-10-CM | POA: Diagnosis not present

## 2021-05-18 DIAGNOSIS — F10239 Alcohol dependence with withdrawal, unspecified: Secondary | ICD-10-CM | POA: Diagnosis not present

## 2021-05-18 DIAGNOSIS — Y900 Blood alcohol level of less than 20 mg/100 ml: Secondary | ICD-10-CM | POA: Diagnosis not present

## 2021-05-18 DIAGNOSIS — Z20822 Contact with and (suspected) exposure to covid-19: Secondary | ICD-10-CM | POA: Diagnosis not present

## 2021-05-18 DIAGNOSIS — F32A Depression, unspecified: Secondary | ICD-10-CM | POA: Diagnosis not present

## 2021-05-18 DIAGNOSIS — F1910 Other psychoactive substance abuse, uncomplicated: Secondary | ICD-10-CM | POA: Diagnosis not present

## 2021-05-18 DIAGNOSIS — F1024 Alcohol dependence with alcohol-induced mood disorder: Secondary | ICD-10-CM | POA: Diagnosis not present

## 2021-05-19 ENCOUNTER — Telehealth: Payer: Self-pay

## 2021-05-19 NOTE — Telephone Encounter (Signed)
Patient Name: Jose Carr Gender: Male DOB: [birth year] Age: 31 Y 7 M 19 D Return Phone Number: [phone] (Primary) Address: City/ State/ Zip: [place] [address] Client [facility] - Administrator, [facility] at [facility] Insurance claims handler, Media planner- PA Contact Type Call Who Is Calling Patient / Member / Family / Caregiver Call Type Triage / Clinical Relationship To Patient Self Return Phone Number [phone] (Primary) Chief Complaint Substance Abuse And Dependence Reason for Call Symptomatic / Request for Health Information Initial Comment caller states that caller is having issues with substance abuse. hasn't drank in a few days. he would like to speak about mental health. He is feeling tired, hasn't eaten and feeling nauseous. Tried to get admitted last night but could not due to not having SI or medical emergency. Translation No Nurse Assessment Nurse: Doylene Canard, RN, Lesa Date/Time (Eastern Time): 05/19/2021 10:04:46 AM Confirm and document reason for call. If symptomatic, describe symptoms. ---Caller states he has substance and alcohol abuse. He takes Wellbutrin 100 mg doesn't seem to be helping his anxiety. He would like help and is willing to go to a treatment center Does the patient have any new or worsening symptoms? ---Yes Will a triage be completed? ---Yes Related visit to physician within the last 2 weeks? ---Yes Does the PT have any chronic conditions? (i.e. diabetes, asthma, this includes High risk factors for pregnancy, etc.) ---Yes List chronic conditions. ---asthma Is this a behavioral health or substance abuse call? ---No Guidelines Guideline Title Affirmed Question Affirmed Notes Nurse Date/Time (Eastern Time) Alcohol Use and Problems Feeling very shaky (i.e., visible tremors of hands) Conner, RN, Lesa 05/19/2021 10:09:01

## 2021-05-19 NOTE — Telephone Encounter (Signed)
Patient has called in stating he has had a lot going on with substance abuse.  States he went to [facility] looking for inpatient treatment for substance abuse.  States they did not keep him. I have given patient address and phone number for the [facility] urgent facility on [address]. I have also transferred patient over to the nurse line for triage.   Patient wanted to know if [facility] know of a inpatient facility he can reach out to?   Please follow up with patient in regard.

## 2021-05-19 NOTE — Telephone Encounter (Signed)
See note

## 2021-05-19 NOTE — Telephone Encounter (Signed)
Patient called said that he contact [facility] and it was not affordable for him. He requested if it would be more appropriate for him to go inpatient as he finds himself backsliding anytime he tries.

## 2021-05-19 NOTE — Telephone Encounter (Signed)
Spoke with Patient advise per PCP to call  [facility]  Phone: [phone], [place], [place] [address] Patient stated will call them today  If any question please give [place] a call back

## 2021-05-20 NOTE — Telephone Encounter (Signed)
Please advise 

## 2021-05-20 NOTE — Telephone Encounter (Signed)
Patient called asking if we have found a location for him. I asked if he wanted to wait for his provider's advice as she was out of the office and he responded yes. He would like her opinion and advice. He can wait until she returns.

## 2021-05-26 ENCOUNTER — Telehealth: Payer: Self-pay | Admitting: Physician Assistant

## 2021-05-26 NOTE — Telephone Encounter (Signed)
error 

## 2021-05-26 NOTE — Telephone Encounter (Deleted)
Pt was returning a call.  

## 2021-05-26 NOTE — Telephone Encounter (Signed)
Patient notified of message below,patient scheduled appt to follow up with Alyssa regarding medication

## 2021-05-28 ENCOUNTER — Ambulatory Visit (INDEPENDENT_AMBULATORY_CARE_PROVIDER_SITE_OTHER): Payer: BC Managed Care – PPO | Admitting: Physician Assistant

## 2021-05-28 ENCOUNTER — Other Ambulatory Visit: Payer: Self-pay

## 2021-05-28 ENCOUNTER — Encounter: Payer: Self-pay | Admitting: Physician Assistant

## 2021-05-28 VITALS — BP 115/77 | HR 74 | Temp 98.0°F | Ht 67.0 in | Wt 237.2 lb

## 2021-05-28 DIAGNOSIS — F32A Depression, unspecified: Secondary | ICD-10-CM | POA: Diagnosis not present

## 2021-05-28 DIAGNOSIS — F419 Anxiety disorder, unspecified: Secondary | ICD-10-CM

## 2021-05-28 DIAGNOSIS — F1990 Other psychoactive substance use, unspecified, uncomplicated: Secondary | ICD-10-CM | POA: Diagnosis not present

## 2021-05-28 MED ORDER — CITALOPRAM HYDROBROMIDE 20 MG PO TABS: 20.0000 mg | ORAL_TABLET | Freq: Every day | ORAL | 0 refills | Status: DC

## 2021-05-28 NOTE — Patient Instructions (Signed)
-  Referral sent to [facility]  - You may also call [facility] - Phone: [phone] & ask if Dr. Drusilla Kanner may be able to assist with this  -Continue Wellbutrin twice daily  -Add Celexa 20 mg daily   Low threshold for ED if any acute symptoms.

## 2021-05-28 NOTE — Progress Notes (Signed)
Subjective:    Patient ID: Jose Carr, male    DOB: Apr 24, 1991, 31 y.o.   MRN: [id number]  Chief Complaint  Patient presents with   Medication Management    HPI Patient is in today for recheck on anxiety and depression and substance use disorder. Patient is very eager to seek additional help from specialist at this time. He is struggling with feeling on edge and more anxiety lately. He went to the ED on 05/19/21 requesting inpatient help, but was denied access because he did not meet crisis criteria. Pt denies any SI / HI. He is still taking Wellbutrin SR 150 mg BID, but says he doesn't feel like it is working as well anymore. He says that alcohol is his biggest problem and he wants help to quit. I asked him about testing positive for [facility] and cocaine at ED as well and he says he's not as concerned about these being an issue, as that was more of a party situation & not something regular.   Past Medical History:  Diagnosis Date   ADHD (attention deficit hyperactivity disorder)    Anxiety    Asthma    COPD (chronic obstructive pulmonary disease) ([place])    Depression    Substance abuse ([place])     Past Surgical History:  Procedure Laterality Date   CARPAL TUNNEL RELEASE     HAND SURGERY     HAND SURGERY     KNEE SURGERY     KNEE SURGERY      Family History  Problem Relation Age of Onset   COPD Father    Hypertension Father    Bipolar disorder Father    Schizophrenia Father    Bipolar disorder Sister    COPD Paternal Grandmother    COPD Paternal Grandfather     Social History   Tobacco Use   Smoking status: Every Day    Packs/day: 2.00    Types: Cigarettes   Smokeless tobacco: Never  Vaping Use   Vaping Use: Some days  Substance Use Topics   Alcohol use: Yes    Alcohol/week: 14.0 standard drinks    Types: 14 Cans of beer per week    Comment: 2 tall boys after work each morning   Drug use: Yes    Types: Marijuana    Comment: daily use     No Known  Allergies  Review of Systems NEGATIVE UNLESS OTHERWISE INDICATED IN HPI      Objective:     BP 115/77    Pulse 74    Temp 98 F (36.7 C)    Ht 5\' 7"  (1.702 m)    Wt 237 lb 3.2 oz (107.6 kg)    SpO2 98%    BMI 37.15 kg/m   Wt Readings from Last 3 Encounters:  05/28/21 237 lb 3.2 oz (107.6 kg)  05/01/21 237 lb 3.2 oz (107.6 kg)  01/16/21 229 lb 6.1 oz (104 kg)    BP Readings from Last 3 Encounters:  05/28/21 115/77  05/01/21 123/84  01/16/21 112/73     Physical Exam Vitals and nursing note reviewed.  Constitutional:      General: He is not in acute distress.    Appearance: Normal appearance. He is not toxic-appearing.  HENT:     Head: Normocephalic and atraumatic.     Right Ear: External ear normal.     Left Ear: External ear normal.     Nose: Nose normal.     Mouth/Throat:  Mouth: Mucous membranes are moist.     Pharynx: Oropharynx is clear.  Eyes:     Extraocular Movements: Extraocular movements intact.     Conjunctiva/sclera: Conjunctivae normal.     Pupils: Pupils are equal, round, and reactive to light.  Cardiovascular:     Rate and Rhythm: Normal rate and regular rhythm.     Pulses: Normal pulses.     Heart sounds: Normal heart sounds.  Pulmonary:     Effort: Pulmonary effort is normal.     Breath sounds: Normal breath sounds.  Musculoskeletal:        General: Normal range of motion.     Cervical back: Normal range of motion and neck supple.  Skin:    General: Skin is warm and dry.  Neurological:     General: No focal deficit present.     Mental Status: He is alert and oriented to person, place, and time.  Psychiatric:        Mood and Affect: Mood normal.        Behavior: Behavior normal.     Comments: Depressed mood but hopeful and motivated       Assessment & Plan:   Problem List Items Addressed This Visit   None Visit Diagnoses     Anxiety and depression    -  Primary   Relevant Medications   citalopram (CELEXA) 20 MG tablet   Other  Relevant Orders   Ambulatory referral to [facility]   Substance use disorder       Relevant Orders   Ambulatory referral to [facility]        Meds ordered this encounter  Medications   citalopram (CELEXA) 20 MG tablet    Sig: Take 1 tablet (20 mg total) by mouth daily.    Dispense:  30 tablet    Refill:  0   1. Anxiety and depression 2. Substance use disorder -Pt is ready to seek additional help from specialist at this time, very supportive and encouraging of this -Referral placed to [facility]. Also provided number to [facility] again -While waiting for specialty placement, agreed together to add Celexa 20 mg daily to his Wellbutrin and see if this can offer additional relief of his anxiety and depression. Possible SE discussed. -He will have close f/up with me in 2 weeks -Low threshold for ED if any acute symptoms  -encouraged to do his best to avoid substances    This note was prepared with assistance of Dragon voice recognition software. Occasional wrong-word or sound-a-like substitutions may have occurred due to the inherent limitations of voice recognition software.  Time Spent: 30 minutes of total time was spent on the date of the encounter performing the following actions: chart review prior to seeing the patient, obtaining history, performing a medically necessary exam, counseling on the treatment plan, placing orders, and documenting in our EHR.    Loyal Holzheimer M Marsden Zaino, PA-C

## 2021-06-18 ENCOUNTER — Ambulatory Visit: Payer: BC Managed Care – PPO | Admitting: Physician Assistant

## 2021-06-19 ENCOUNTER — Other Ambulatory Visit: Payer: Self-pay

## 2021-06-19 ENCOUNTER — Ambulatory Visit (INDEPENDENT_AMBULATORY_CARE_PROVIDER_SITE_OTHER): Payer: BC Managed Care – PPO | Admitting: Physician Assistant

## 2021-06-19 ENCOUNTER — Encounter: Payer: Self-pay | Admitting: Physician Assistant

## 2021-06-19 VITALS — BP 117/70 | HR 78 | Temp 98.0°F | Ht 67.0 in | Wt 232.0 lb

## 2021-06-19 DIAGNOSIS — F1010 Alcohol abuse, uncomplicated: Secondary | ICD-10-CM

## 2021-06-19 DIAGNOSIS — F32A Depression, unspecified: Secondary | ICD-10-CM

## 2021-06-19 DIAGNOSIS — F419 Anxiety disorder, unspecified: Secondary | ICD-10-CM

## 2021-06-19 DIAGNOSIS — R454 Irritability and anger: Secondary | ICD-10-CM | POA: Diagnosis not present

## 2021-06-19 DIAGNOSIS — F1990 Other psychoactive substance use, unspecified, uncomplicated: Secondary | ICD-10-CM

## 2021-06-19 MED ORDER — CITALOPRAM HYDROBROMIDE 20 MG PO TABS: 20.0000 mg | ORAL_TABLET | Freq: Every day | ORAL | 2 refills | Status: DC

## 2021-06-19 NOTE — Progress Notes (Signed)
Subjective:    Patient ID: Jose Carr, male    DOB: 1990/07/20, 31 y.o.   MRN: [id number]  Chief Complaint  Patient presents with   Medication Management    HPI Patient is in today for f/up from 05/28/21.  Takes Celexa 20 mg in the mornings because it was keeping him awake Nausea / diarrhea / insomnia the first week, pushed through and now seems to be helping with moods. Seems to pair well with his Wellbutrin 150 mg BID. He does not have any complaints or concerns about his medications.   Has not had any alcohol in the last month and feeling good and very proud of this.   Wife has mental health history and has been working with her PCP as well and he says they are both doing well in their marriage right now. Working to better themselves and their relationship to improve lives for their kids as well.   First appt 07/08/21 - [facility]  Past Medical History:  Diagnosis Date   ADHD (attention deficit hyperactivity disorder)    Anxiety    Asthma    COPD (chronic obstructive pulmonary disease) (HCC)    Depression    Substance abuse (HCC)     Past Surgical History:  Procedure Laterality Date   CARPAL TUNNEL RELEASE     HAND SURGERY     HAND SURGERY     KNEE SURGERY     KNEE SURGERY      Family History  Problem Relation Age of Onset   COPD Father    Hypertension Father    Bipolar disorder Father    Schizophrenia Father    Bipolar disorder Sister    COPD Paternal Grandmother    COPD Paternal Grandfather     Social History   Tobacco Use   Smoking status: Every Day    Packs/day: 2.00    Types: Cigarettes   Smokeless tobacco: Never  Vaping Use   Vaping Use: Some days  Substance Use Topics   Alcohol use: Yes    Alcohol/week: 14.0 standard drinks    Types: 14 Cans of beer per week    Comment: 2 tall boys after work each morning   Drug use: Yes    Types: Marijuana    Comment: daily use     No Known Allergies  Review of Systems NEGATIVE UNLESS  OTHERWISE INDICATED IN HPI      Objective:     BP 117/70    Pulse 78    Temp 98 F (36.7 C)    Ht 5\' 7"  (1.702 m)    Wt 232 lb (105.2 kg)    SpO2 98%    BMI 36.34 kg/m   Wt Readings from Last 3 Encounters:  06/19/21 232 lb (105.2 kg)  05/28/21 237 lb 3.2 oz (107.6 kg)  05/01/21 237 lb 3.2 oz (107.6 kg)    BP Readings from Last 3 Encounters:  06/19/21 117/70  05/28/21 115/77  05/01/21 123/84     Physical Exam Constitutional:      General: He is not in acute distress.    Appearance: Normal appearance. He is obese. He is not ill-appearing.  Cardiovascular:     Rate and Rhythm: Normal rate and regular rhythm.     Pulses: Normal pulses.     Heart sounds: Normal heart sounds.  Pulmonary:     Effort: Pulmonary effort is normal.  Neurological:     Mental Status: He is alert.  Psychiatric:  Mood and Affect: Mood normal.        Behavior: Behavior normal.     Comments: Seems more upbeat and joyful today        Assessment & Plan:   Problem List Items Addressed This Visit   None Visit Diagnoses     Anxiety and depression    -  Primary   Relevant Medications   citalopram (CELEXA) 20 MG tablet   Substance use disorder       Irritability and anger       Alcohol abuse            Meds ordered this encounter  Medications   citalopram (CELEXA) 20 MG tablet    Sig: Take 1 tablet (20 mg total) by mouth daily.    Dispense:  30 tablet    Refill:  2    Plan: -Very proud of him, congratulated him on his alcohol cessation so far. -Very glad he will be following up with [facility] soon -Doing well with Wellbutrin and Celexa combo, continue this for now. Refilled Celexa 20 mg. -Encouraged him to cont to work on his marriage and home life and to avoid substances  F/up in approx 2 months or prn  Dresden Ament M Charlii Yost, PA-C

## 2021-07-02 ENCOUNTER — Ambulatory Visit: Payer: BC Managed Care – PPO | Admitting: Physician Assistant

## 2021-07-08 ENCOUNTER — Telehealth (HOSPITAL_COMMUNITY): Payer: BC Managed Care – PPO | Admitting: Psychiatry

## 2021-07-08 ENCOUNTER — Telehealth (HOSPITAL_BASED_OUTPATIENT_CLINIC_OR_DEPARTMENT_OTHER): Payer: BC Managed Care – PPO | Admitting: Psychiatry

## 2021-07-08 ENCOUNTER — Encounter (HOSPITAL_COMMUNITY): Payer: Self-pay | Admitting: Psychiatry

## 2021-07-08 ENCOUNTER — Other Ambulatory Visit: Payer: Self-pay

## 2021-07-08 VITALS — Wt 232.0 lb

## 2021-07-08 DIAGNOSIS — F319 Bipolar disorder, unspecified: Secondary | ICD-10-CM

## 2021-07-08 DIAGNOSIS — F1910 Other psychoactive substance abuse, uncomplicated: Secondary | ICD-10-CM | POA: Diagnosis not present

## 2021-07-08 MED ORDER — ARIPIPRAZOLE 5 MG PO TABS: ORAL_TABLET | ORAL | 0 refills | Status: DC

## 2021-07-08 MED ORDER — BUPROPION HCL ER (XL) 300 MG PO TB24: 300.0000 mg | ORAL_TABLET | ORAL | 0 refills | Status: DC
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
## 2021-07-23 ENCOUNTER — Other Ambulatory Visit: Payer: Self-pay

## 2021-07-23 ENCOUNTER — Telehealth (HOSPITAL_BASED_OUTPATIENT_CLINIC_OR_DEPARTMENT_OTHER): Payer: BC Managed Care – PPO | Admitting: Psychiatry

## 2021-07-23 ENCOUNTER — Encounter (HOSPITAL_COMMUNITY): Payer: Self-pay | Admitting: Psychiatry

## 2021-07-23 VITALS — Wt 232.0 lb

## 2021-07-23 DIAGNOSIS — F319 Bipolar disorder, unspecified: Secondary | ICD-10-CM | POA: Diagnosis not present

## 2021-07-23 DIAGNOSIS — F1910 Other psychoactive substance abuse, uncomplicated: Secondary | ICD-10-CM | POA: Diagnosis not present

## 2021-07-23 MED ORDER — LAMOTRIGINE 25 MG PO TABS: ORAL_TABLET | ORAL | 0 refills | Status: DC

## 2021-07-23 MED ORDER — BUPROPION HCL ER (XL) 300 MG PO TB24: 300.0000 mg | ORAL_TABLET | ORAL | 0 refills | Status: DC

## 2021-07-23 NOTE — Progress Notes (Signed)
Virtual Visit via Video Note ? ?I connected with Jose Carr on 07/23/21 at  2:00 PM EDT by a video enabled telemedicine application and verified that I am speaking with the correct person using two identifiers. ? ?Location: ?Patient: In Car ?Provider: Home Office ?  ?I discussed the limitations of evaluation and management by telemedicine and the availability of in person appointments. The patient expressed understanding and agreed to proceed. ? ?History of Present Illness: ?Patient is a 31 year old Caucasian, married, employed man who was seen first time 2 weeks ago.  Today he is excited because he is going to buy a car.  Patient was referred from PCP for the management of his psychiatric symptoms.  He had a history of smoking marijuana and alcohol.  He also had history of severe mood swing, anger, paranoia, outburst and agitation.  He was taking Celexa and Wellbutrin.  We started him on low-dose Abilify which she took for 2 weeks but started to notice more irritability and agitation and decided to stop.  We have recommended Celexa to stop but he is back on Celexa 20 mg which calm him down.  He liked the Wellbutrin switching to extended release taking once a day.  He is still had irritability and paranoia like to try a different medication to help his mood.  He is trying to get regular shift as third shift does not work very well for him.  Recently his daughter is sick and had fracture ankle may require surgery.  He apologized not able to schedule appointment with therapist because he is busy taking care of family.  However he is proud that he is not drinking or smoking marijuana since the last visit.  His wife is very supportive.  He lives with his 2 biological children who are 55 and 48-year-old and 36 year old Psychiatrist.  He is watching his calorie and stop drinking soda and he may have lost a few pounds but did not check.  Patient has carpal tunnel and pain in his right hand and left hand.  He is waiting for  surgery on his left hand.  Denies any suicidal thoughts. ? ? ?Past Psychiatric History: ?H/O drinking and THC. Diagnosed ADHD in school but not consistent with the medication.  H/O mood swings, highs and lows, mania, impulsive and reckless behavior.  No h/o suicidal attempt or inpatient treatment.  H/O brief ER stay in January 2023 at [facility]. PCP tried Wellbutrin, hydroxyzine and Celexa. ? ?Psychiatric Specialty Exam: ?Physical Exam  ?Review of Systems  ?Weight 232 lb (105.2 kg).There is no height or weight on file to calculate BMI.  ?General Appearance: Casual  ?Eye Contact:  Good  ?Speech:  Normal Rate  ?Volume:  Normal  ?Mood:  Anxious and Irritable  ?Affect:  Congruent  ?Thought Process:  Goal Directed  ?Orientation:  Full (Time, Place, and Person)  ?Thought Content:  Paranoid Ideation and Rumination  ?Suicidal Thoughts:  No  ?Homicidal Thoughts:  No  ?Memory:  Immediate;   Good ?Recent;   Fair ?Remote;   Fair  ?Judgement:  Fair  ?Insight:  Present  ?Psychomotor Activity:  Increased  ?Concentration:  Concentration: Fair and Attention Span: Fair  ?Recall:  Good  ?Fund of Knowledge:  Good  ?Language:  Good  ?Akathisia:  No  ?Handed:  Right  ?AIMS (if indicated):     ?Assets:  Communication Skills ?Desire for Improvement ?Housing ?Resilience ?Social Support ?Talents/Skills ?Transportation  ?ADL's:  Intact  ?Cognition:  WNL  ?Sleep:   fair  ? ? ? ? ?  Assessment and Plan: ?Bipolar disorder type I.  Substance abuse. ? ?Discontinue Abilify as patient noticed more irritability and anger with Abilify.  For now he will keep the Celexa 20 mg prescribed by primary care physician.  We will continue Wellbutrin XL 300 mg daily.  We will try Lamictal 25 mg daily for 1 week and then 50 mg daily.  Patient is not drinking alcohol and not smoking marijuana since the last visit.  He like to establish care with a therapist once he has more time to do therapy.  I recommended to call us back if is any question or any concern.  Discussed  medication side effects and benefits specially Lamictal can cause rash and in that case he need to stop the medication immediately.  We will follow up in 4 weeks. ? ?Collaboration of Care: Primary Care Provider AEB notes are available in epic to review by PCP. ? ?Patient/Guardian was advised Release of Information must be obtained prior to any record release in order to collaborate their care with an outside provider. Patient/Guardian was advised if they have not already done so to contact the registration department to sign all necessary forms in order for [place] to release information regarding their care.  ? ?Consent: Patient/Guardian gives verbal consent for treatment and assignment of benefits for services provided during this visit. Patient/Guardian expressed understanding and agreed to proceed.   ? ?Follow Up Instructions: ? ?  ?I discussed the assessment and treatment plan with the patient. The patient was provided an opportunity to ask questions and all were answered. The patient agreed with the plan and demonstrated an understanding of the instructions. ?  ?The patient was advised to call back or seek an in-person evaluation if the symptoms worsen or if the condition fails to improve as anticipated. ? ?I provided 28 minutes of non-face-to-face time during this encounter. ? ? ?Kathlee Nations, MD  ?

## 2021-07-31 ENCOUNTER — Ambulatory Visit (HOSPITAL_COMMUNITY): Payer: BC Managed Care – PPO | Admitting: Licensed Clinical Social Worker

## 2021-07-31 ENCOUNTER — Telehealth (HOSPITAL_COMMUNITY): Payer: Self-pay | Admitting: Licensed Clinical Social Worker

## 2021-07-31 ENCOUNTER — Encounter (HOSPITAL_COMMUNITY): Payer: Self-pay

## 2021-07-31 NOTE — Telephone Encounter (Signed)
As Jose Carr has not arrived for his 1 o'clock appointment today, the therapist attempts to reach him by phone leaving a HIPAA-compliant voicemail. ? ?Adam Phenix, MA, LCSW, [facility], LCAS ?07/31/2021 ?

## 2021-08-21 ENCOUNTER — Telehealth (HOSPITAL_BASED_OUTPATIENT_CLINIC_OR_DEPARTMENT_OTHER): Payer: BC Managed Care – PPO | Admitting: Psychiatry

## 2021-08-21 ENCOUNTER — Encounter (HOSPITAL_COMMUNITY): Payer: Self-pay | Admitting: Psychiatry

## 2021-08-21 DIAGNOSIS — F319 Bipolar disorder, unspecified: Secondary | ICD-10-CM | POA: Diagnosis not present

## 2021-08-21 DIAGNOSIS — F1910 Other psychoactive substance abuse, uncomplicated: Secondary | ICD-10-CM | POA: Diagnosis not present

## 2021-08-21 MED ORDER — BUPROPION HCL ER (XL) 300 MG PO TB24: 300.0000 mg | ORAL_TABLET | ORAL | 1 refills | Status: DC

## 2021-08-21 MED ORDER — LAMOTRIGINE 100 MG PO TABS: 100.0000 mg | ORAL_TABLET | Freq: Every day | ORAL | 1 refills | Status: DC

## 2021-08-21 NOTE — Progress Notes (Signed)
Virtual Visit via Telephone Note ? ?I connected with Jose Carr on 08/21/21 at  2:00 PM EDT by telephone and verified that I am speaking with the correct person using two identifiers. ? ?Location: ?Patient: Home ?Provider: Home Office ?  ?I discussed the limitations, risks, security and privacy concerns of performing an evaluation and management service by telephone and the availability of in person appointments. I also discussed with the patient that there may be a patient responsible charge related to this service. The patient expressed understanding and agreed to proceed. ? ? ?History of Present Illness: ?Patient is evaluated by phone session.  He is doing better with the Lamictal.  He has not seen a significant improvement but better than Abilify.  He is also sleeping better since moved to his regular first shift.  He is anxious about his daughter who had fracture ankle more than a month ago and now need visit at [facility] for evaluation.  He is taking Wellbutrin but taking Celexa.  He has some irritability and mood swings but overall he likes Lamictal.  He has no rash or any itching.  He has chronic carpal tunnel pain.  Patient lives with his wife who is very supportive and he has 2 biological children who are 4 and 17-year-old and 43 year old Psychiatrist.  He admitted smoking marijuana which he had tried to stop but could not do it.  However he is no longer drinking.  His appetite is okay.  He denies any mania, psychosis or any hallucination. ?   ?Past Psychiatric History: ?H/O drinking and THC. Diagnosed ADHD in school but not consistent with the medication.  H/O mood swings, highs and lows, mania, impulsive and reckless behavior.  No h/o suicidal attempt or inpatient treatment.  H/O brief ER stay in January 2023 at [facility]. PCP tried Wellbutrin, hydroxyzine and Celexa. ? ? ?Psychiatric Specialty Exam: ?Physical Exam  ?Review of Systems  ?Weight 232 lb (105.2 kg).There is no height or weight on file to calculate  BMI.  ?General Appearance: NA  ?Eye Contact:  NA  ?Speech:  Clear and Coherent and Normal Rate  ?Volume:  Normal  ?Mood:  Euthymic  ?Affect:  NA  ?Thought Process:  Goal Directed  ?Orientation:  Full (Time, Place, and Person)  ?Thought Content:  WDL  ?Suicidal Thoughts:  No  ?Homicidal Thoughts:  No  ?Memory:  Immediate;   Good ?Recent;   Good ?Remote;   Good  ?Judgement:  Intact  ?Insight:  Fair  ?Psychomotor Activity:  NA  ?Concentration:  Concentration: Good and Attention Span: Good  ?Recall:  Good  ?Fund of Knowledge:  Good  ?Language:  Good  ?Akathisia:  No  ?Handed:  Right  ?AIMS (if indicated):     ?Assets:  Communication Skills ?Desire for Improvement ?Housing ?Resilience ?Social Support ?Talents/Skills  ?ADL's:  Intact  ?Cognition:  WNL  ?Sleep:   ok  ?  ? ? ?Assessment and Plan: ?Bipolar disorder type I.  Substance use mainly cannabis. ? ?Patient doing better on Lamictal but still have mood symptoms.  Recommend to increase Lamictal to 75 mg for 1 week and then 100 mg daily.  Continue Wellbutrin XL 300 mg daily.  He has no rash, itching tremors or shakes.  He still smoke marijuana but feels that he is not drinking.  He tried to cut down marijuana but he cannot stop completely smoking marijuana.  Discussed medication side effect specially interaction with psychotropic medication with substance use.  Reminded if he have a rash  and any other concern from the medicine that he need to call us immediately.  Follow-up in 2 months. ? ?Follow Up Instructions: ? ?  ?I discussed the assessment and treatment plan with the patient. The patient was provided an opportunity to ask questions and all were answered. The patient agreed with the plan and demonstrated an understanding of the instructions. ?  ?The patient was advised to call back or seek an in-person evaluation if the symptoms worsen or if the condition fails to improve as anticipated. ? ?I provided 17 minutes of non-face-to-face time during this  encounter. ? ? ?Kathlee Nations, MD  ?

## 2021-10-21 ENCOUNTER — Telehealth (HOSPITAL_BASED_OUTPATIENT_CLINIC_OR_DEPARTMENT_OTHER): Payer: BC Managed Care – PPO | Admitting: Psychiatry

## 2021-10-21 ENCOUNTER — Encounter (HOSPITAL_COMMUNITY): Payer: Self-pay | Admitting: Psychiatry

## 2021-10-21 DIAGNOSIS — F319 Bipolar disorder, unspecified: Secondary | ICD-10-CM

## 2021-10-21 DIAGNOSIS — F1910 Other psychoactive substance abuse, uncomplicated: Secondary | ICD-10-CM

## 2021-10-21 MED ORDER — BUPROPION HCL ER (XL) 300 MG PO TB24: 300.0000 mg | ORAL_TABLET | ORAL | 0 refills | Status: DC

## 2021-10-21 MED ORDER — LAMOTRIGINE 100 MG PO TABS: 100.0000 mg | ORAL_TABLET | Freq: Every day | ORAL | 0 refills | Status: DC

## 2021-11-21 ENCOUNTER — Emergency Department (HOSPITAL_COMMUNITY): Payer: BC Managed Care – PPO

## 2021-11-21 ENCOUNTER — Emergency Department (HOSPITAL_COMMUNITY)
Admission: EM | Admit: 2021-11-21 | Discharge: 2021-11-21 | Discharge status: Against Medical Advice | Payer: BC Managed Care – PPO | Attending: Emergency Medicine | Admitting: Emergency Medicine

## 2021-11-21 ENCOUNTER — Encounter: Payer: Self-pay | Admitting: Physician Assistant

## 2021-11-21 ENCOUNTER — Encounter (HOSPITAL_COMMUNITY): Payer: Self-pay | Admitting: Emergency Medicine

## 2021-11-21 ENCOUNTER — Other Ambulatory Visit: Payer: Self-pay

## 2021-11-21 ENCOUNTER — Ambulatory Visit (INDEPENDENT_AMBULATORY_CARE_PROVIDER_SITE_OTHER): Payer: BC Managed Care – PPO | Admitting: Physician Assistant

## 2021-11-21 VITALS — BP 118/78 | HR 78 | Ht 67.0 in | Wt 238.0 lb

## 2021-11-21 DIAGNOSIS — R112 Nausea with vomiting, unspecified: Secondary | ICD-10-CM | POA: Diagnosis not present

## 2021-11-21 DIAGNOSIS — M545 Low back pain, unspecified: Secondary | ICD-10-CM | POA: Diagnosis not present

## 2021-11-21 DIAGNOSIS — R0789 Other chest pain: Secondary | ICD-10-CM | POA: Diagnosis not present

## 2021-11-21 DIAGNOSIS — J984 Other disorders of lung: Secondary | ICD-10-CM | POA: Diagnosis not present

## 2021-11-21 DIAGNOSIS — R079 Chest pain, unspecified: Secondary | ICD-10-CM | POA: Insufficient documentation

## 2021-11-21 DIAGNOSIS — R109 Unspecified abdominal pain: Secondary | ICD-10-CM

## 2021-11-21 DIAGNOSIS — R1031 Right lower quadrant pain: Secondary | ICD-10-CM | POA: Insufficient documentation

## 2021-11-21 DIAGNOSIS — R1011 Right upper quadrant pain: Secondary | ICD-10-CM | POA: Diagnosis not present

## 2021-11-21 DIAGNOSIS — J9811 Atelectasis: Secondary | ICD-10-CM | POA: Diagnosis not present

## 2021-11-21 DIAGNOSIS — J449 Chronic obstructive pulmonary disease, unspecified: Secondary | ICD-10-CM | POA: Diagnosis not present

## 2021-11-21 DIAGNOSIS — J45909 Unspecified asthma, uncomplicated: Secondary | ICD-10-CM | POA: Diagnosis not present

## 2021-11-21 DIAGNOSIS — Z7951 Long term (current) use of inhaled steroids: Secondary | ICD-10-CM | POA: Insufficient documentation

## 2021-11-21 LAB — CBC
HCT: 45.3 % (ref 39.0–52.0)
Hemoglobin: 15.5 g/dL (ref 13.0–17.0)
MCH: 30.6 pg (ref 26.0–34.0)
MCHC: 34.2 g/dL (ref 30.0–36.0)
MCV: 89.3 fL (ref 80.0–100.0)
Platelets: 224 10*3/uL (ref 150–400)
RBC: 5.07 MIL/uL (ref 4.22–5.81)
RDW: 12.1 % (ref 11.5–15.5)
WBC: 9.9 10*3/uL (ref 4.0–10.5)
nRBC: 0 % (ref 0.0–0.2)

## 2021-11-21 LAB — COMPREHENSIVE METABOLIC PANEL
ALT: 19 U/L (ref 0–44)
AST: 19 U/L (ref 15–41)
Albumin: 4 g/dL (ref 3.5–5.0)
Alkaline Phosphatase: 45 U/L (ref 38–126)
Anion gap: 7 (ref 5–15)
BUN: 13 mg/dL (ref 6–20)
CO2: 25 mmol/L (ref 22–32)
Calcium: 9.1 mg/dL (ref 8.9–10.3)
Chloride: 105 mmol/L (ref 98–111)
Creatinine, Ser: 0.99 mg/dL (ref 0.61–1.24)
GFR, Estimated: >60 mL/min (ref >60)
Glucose, Bld: 86 mg/dL (ref 70–99)
Potassium: 4.3 mmol/L (ref 3.5–5.1)
Sodium: 137 mmol/L (ref 135–145)
Total Bilirubin: 0.5 mg/dL (ref 0.3–1.2)
Total Protein: 6.5 g/dL (ref 6.5–8.1)

## 2021-11-21 LAB — URINALYSIS, ROUTINE W REFLEX MICROSCOPIC
Bilirubin Urine: NEGATIVE
Glucose, UA: NEGATIVE mg/dL
Hgb urine dipstick: NEGATIVE
Ketones, ur: NEGATIVE mg/dL
Leukocytes,Ua: NEGATIVE
Nitrite: NEGATIVE
Protein, ur: NEGATIVE mg/dL
Specific Gravity, Urine: 1.008 (ref 1.005–1.030)
pH: 8 (ref 5.0–8.0)

## 2021-11-21 LAB — LIPASE, BLOOD: Lipase: 24 U/L (ref 11–51)

## 2021-11-21 LAB — D-DIMER, QUANTITATIVE: D-Dimer, Quant: <0.27 ug/mL-FEU (ref 0.00–0.50)

## 2021-11-21 LAB — TROPONIN I (HIGH SENSITIVITY): Troponin I (High Sensitivity): <2 ng/L (ref <18)

## 2021-11-21 MED ORDER — IOHEXOL 300 MG/ML  SOLN
100.0000 mL | Freq: Once | INTRAMUSCULAR | Status: AC | PRN
  Administered 2021-11-21: 100 mL via INTRAVENOUS

## 2021-11-21 NOTE — ED Triage Notes (Signed)
Pt reports abdominal pain X a while now. Pt reports being sent by PCP for possible appendicitis.

## 2021-11-21 NOTE — Discharge Instructions (Signed)
Your testing shows normal gallbladder and appendix.  Unclear etiology of your abdominal pain but you may have passed a kidney stone.  Start Prilosec to protect your stomach.  Avoid alcohol, NSAID medications, spicy foods, caffeine. Follow-up with your primary doctor.  Return to ED with worsening pain, fever, vomiting, or other concerns.

## 2021-11-21 NOTE — ED Provider Triage Note (Signed)
Emergency Medicine Provider Triage Evaluation Note  Jose Carr , a 31 y.o. male  was evaluated in triage.  Pt complains of epigastric abdominal pain.  He states that this has been ongoing for months realistically but worse over the past 1 week.  He describes epigastric and right upper quadrant abdominal pain that occasionally radiates to the right chest and back.  He states that it is worse after eating.  He endorses nausea and episode of vomiting.  He denies diarrhea, dysuria, penile discharge, fevers or previous abdominal surgeries.  No excessive Tylenol, NSAID, alcohol use  Review of Systems  Positive: See above Negative:   Physical Exam  BP (!) 139/106   Pulse 76   Temp 98.4 F (36.9 C) (Oral)   Resp 18   SpO2 98%  Gen:   Awake, no distress   Resp:  Normal effort  MSK:   Moves extremities without difficulty  Other:  Mild tenderness to palpation of the epigastrium and right upper quadrant  Medical Decision Making  Medically screening exam initiated at 11:58 AM.  Appropriate orders placed.  Jose Carr was informed that the remainder of the evaluation will be completed by another provider, this initial triage assessment does not replace that evaluation, and the importance of remaining in the ED until their evaluation is complete.     Cristopher Peru, PA-C 11/21/21 1200

## 2021-11-21 NOTE — ED Provider Notes (Signed)
[facility]-EMERGENCY DEPT Provider Note   CSN: [id number] Arrival date & time: 11/21/21  1116     History  Chief Complaint  Patient presents with   Abdominal Pain    Jose Carr is a 31 y.o. male.  Patient sent from PCPs office with concern for abdominal pain on the right side of his abdomen progressively worsening over the past month.  He describes pain to his upper and lower abdomen and has been constant progressively worsening worse with food.  PCP was concerned for appendicitis.  There is been no vomiting.  Pain occasionally radiates to his right chest and his back.  Pain is worse with eating.  Associated with nausea and one episode of vomiting several weeks ago.  No dysuria, diarrhea, fever, pain with urination or blood in the urine.  No history of ulcers or acid reflux.  No history of GERD.  No previous abdominal surgeries  Past medical history includes asthma, COPD and anxiety.  The history is provided by the patient.  Abdominal Pain      Home Medications Prior to Admission medications   Medication Sig Start Date End Date Taking? Authorizing Provider  albuterol (PROVENTIL HFA;VENTOLIN HFA) 108 (90 Base) MCG/ACT inhaler Inhale 1-2 puffs into the lungs every 6 (six) hours as needed for wheezing or shortness of breath. 09/28/15   Santiago Glad, PA-C  buPROPion (WELLBUTRIN XL) 300 MG 24 hr tablet Take 1 tablet (300 mg total) by mouth every morning. 10/21/21 10/21/22  Arfeen, Phillips Grout, MD  lamoTRIgine (LAMICTAL) 100 MG tablet Take 1 tablet (100 mg total) by mouth daily. 10/21/21   Arfeen, Phillips Grout, MD      Allergies    Patient has no known allergies.    Review of Systems   Review of Systems  Gastrointestinal:  Positive for abdominal pain.    Physical Exam Updated Vital Signs BP 133/79 (BP Location: Left Arm)   Pulse 72   Temp 97.6 F (36.4 C) (Oral)   Resp 18   SpO2 95%  Physical Exam Vitals and nursing note reviewed.  Constitutional:       General: He is not in acute distress.    Appearance: He is well-developed.  HENT:     Head: Normocephalic and atraumatic.     Mouth/Throat:     Pharynx: No oropharyngeal exudate.  Eyes:     Conjunctiva/sclera: Conjunctivae normal.     Pupils: Pupils are equal, round, and reactive to light.  Neck:     Comments: No meningismus. Cardiovascular:     Rate and Rhythm: Normal rate and regular rhythm.     Heart sounds: Normal heart sounds. No murmur heard. Pulmonary:     Effort: Pulmonary effort is normal. No respiratory distress.     Breath sounds: Normal breath sounds.  Abdominal:     Palpations: Abdomen is soft.     Tenderness: There is abdominal tenderness. There is guarding. There is no rebound.     Comments: Tenderness right upper quadrant right lower quadrant with voluntary guarding.  No rebound  Musculoskeletal:        General: Tenderness present. Normal range of motion.     Cervical back: Normal range of motion and neck supple.     Comments: R paraspinal lumbar tenderness  Skin:    General: Skin is warm.  Neurological:     Mental Status: He is alert and oriented to person, place, and time.     Cranial Nerves: No cranial nerve deficit.  Motor: No abnormal muscle tone.     Coordination: Coordination normal.     Comments:  5/5 strength throughout. CN 2-12 intact.Equal grip strength.   Psychiatric:        Behavior: Behavior normal.     ED Results / Procedures / Treatments   Labs (all labs ordered are listed, but only abnormal results are displayed) Labs Reviewed  URINALYSIS, ROUTINE W REFLEX MICROSCOPIC - Abnormal; Notable for the following components:      Result Value   APPearance HAZY (*)    All other components within normal limits  LIPASE, BLOOD  COMPREHENSIVE METABOLIC PANEL  CBC  D-DIMER, QUANTITATIVE  TROPONIN I (HIGH SENSITIVITY)  TROPONIN I (HIGH SENSITIVITY)    EKG EKG Interpretation  Date/Time:  Friday November 21 2021 11:24:37 EDT Ventricular Rate:   68 PR Interval:  125 QRS Duration: 98 QT Interval:  374 QTC Calculation: 398 R Axis:   78 Text Interpretation: Sinus rhythm Low voltage, precordial leads Anteroseptal infarct, old No significant change was found Confirmed by Glynn Octave [id number]) on 11/21/2021 3:40:12 PM  Radiology US Abdomen Limited RUQ (LIVER/GB)  Result Date: 11/21/2021 CLINICAL DATA:  Right upper quadrant abdominal pain for 3 months. EXAM: ULTRASOUND ABDOMEN LIMITED RIGHT UPPER QUADRANT COMPARISON:  None Available. FINDINGS: Gallbladder: No gallstones or wall thickening visualized. No sonographic Murphy sign noted by sonographer. Common bile duct: Diameter: 4 mm which is within normal limits. Liver: No focal lesion identified. Within normal limits in parenchymal echogenicity. Portal vein is patent on color Doppler imaging with normal direction of blood flow towards the liver. Other: None. IMPRESSION: No abnormality seen in the right upper quadrant of the abdomen. Electronically Signed   By: Lupita Raider M.D.   On: 11/21/2021 12:43    Procedures Procedures    Medications Ordered in ED Medications - No data to display  ED Course/ Medical Decision Making/ A&P                           Medical Decision Making Amount and/or Complexity of Data Reviewed Labs: ordered. Decision-making details documented in ED Course. Radiology: ordered and independent interpretation performed. Decision-making details documented in ED Course. ECG/medicine tests: ordered and independent interpretation performed. Decision-making details documented in ED Course.  Risk Prescription drug management.   Right-sided abdominal pain intermittent for the past 1 month.  Abdomen soft without peritoneal signs.  Vital stable.  Labs in triage show normal LFTs, lipase and white blood cell count.  Ultrasound was done in triage and shows normal gallbladder.  Urinalysis is negative.  No hematuria or infection.  CT scan is obtained to evaluate for  appendix which is normal.  No evidence of appendicitis or other acute pathology.  Results reviewed and interpreted by me.  Patient upset that "nothing has been found".  He states he still having chest pain which has been an ongoing issue for him for quite some time.  He did receive an EKG that did not show any acute pathology or ST segment changes.  He does not however have a chest x-ray.  Troponin is negative x1.  Awaiting repeat troponin as well as D-dimer.  Patient upset that he has been waiting and wants to go home.  He is demanding to leave now and does not want any further testing.  He declines chest x-ray and declines D-dimer and second troponin.  Low suspicion for ACS but discussed with patient his work-up is not complete.  He appears to have capacity to refuse further treatment and leave AGAINST MEDICAL ADVICE. Discussed that a cardiac etiology of his pain has not been ruled out.  Discussed he is free to return at any time.       Final Clinical Impression(s) / ED Diagnoses Final diagnoses:  Right sided abdominal pain  Chest pain, unspecified type    Rx / DC Orders ED Discharge Orders     None         Deidre Carino, Jeannett Senior, MD 11/21/21 1731

## 2021-11-21 NOTE — Patient Instructions (Signed)
Your EKG looks normal, I think this is referred pain from your abdomen. Concern for possible acute appendicitis. You need to go to the ER right away for labs and imaging. I will call ahead of your arrival.

## 2021-11-21 NOTE — Progress Notes (Signed)
Subjective:    Patient ID: Jose Carr, male    DOB: 1990-12-06, 31 y.o.   MRN: [id number]  Chief Complaint  Patient presents with   Chest Pain    Right side over a week increased over time increased with eating.     HPI Patient is in today for right sided chest pain. When asking location of pain, he points to right upper abdomen. States pain goes into his back, up his chest, and into R shoulder. States pain started suddenly around 6 or 7 pm last night; he almost went to the ED because of pain. Started after eating spaghetti. Has not had anything to eat today b/c he feels like pain is worse after eating. Some nausea. Bowel movements normal. No fever or chills. No SOB. No vomiting. No hx abdominal surgeries.   Past Medical History:  Diagnosis Date   ADHD (attention deficit hyperactivity disorder)    Anxiety    Asthma    COPD (chronic obstructive pulmonary disease) (HCC)    Depression    Substance abuse (HCC)     Past Surgical History:  Procedure Laterality Date   CARPAL TUNNEL RELEASE     HAND SURGERY     HAND SURGERY     KNEE SURGERY     KNEE SURGERY      Family History  Problem Relation Age of Onset   COPD Father    Hypertension Father    Bipolar disorder Father    Schizophrenia Father    Bipolar disorder Sister    COPD Paternal Grandmother    COPD Paternal Grandfather     Social History   Tobacco Use   Smoking status: Every Day    Packs/day: 2.00    Types: Cigarettes   Smokeless tobacco: Never  Vaping Use   Vaping Use: Some days  Substance Use Topics   Alcohol use: Yes    Alcohol/week: 14.0 standard drinks of alcohol    Types: 14 Cans of beer per week    Comment: 2 tall boys after work each morning   Drug use: Yes    Types: Marijuana    Comment: daily use     No Known Allergies  Review of Systems NEGATIVE UNLESS OTHERWISE INDICATED IN HPI      Objective:     BP 118/78   Pulse 78   Ht 5\' 7"  (1.702 m)   Wt 238 lb (108 kg)   SpO2 94%   BMI  37.28 kg/m   Wt Readings from Last 3 Encounters:  11/21/21 238 lb (108 kg)  10/21/21 240 lb (108.9 kg)  08/21/21 232 lb (105.2 kg)    BP Readings from Last 3 Encounters:  11/21/21 (!) 139/106  11/21/21 118/78  06/19/21 117/70     Physical Exam Vitals and nursing note reviewed.  Constitutional:      General: He is not in acute distress.    Appearance: Normal appearance. He is not toxic-appearing.  HENT:     Head: Normocephalic and atraumatic.  Eyes:     Extraocular Movements: Extraocular movements intact.     Conjunctiva/sclera: Conjunctivae normal.     Pupils: Pupils are equal, round, and reactive to light.  Cardiovascular:     Rate and Rhythm: Normal rate and regular rhythm.     Pulses: Normal pulses.     Heart sounds: Normal heart sounds.  Pulmonary:     Effort: Pulmonary effort is normal.     Breath sounds: Normal breath sounds.  Abdominal:  General: Abdomen is flat. Bowel sounds are normal.     Palpations: Abdomen is soft. There is no mass.     Tenderness: There is abdominal tenderness in the right upper quadrant and right lower quadrant. There is guarding and rebound.     Comments: Tender along R sided abdomen, RLQ is worse than RUQ with palpation  Musculoskeletal:        General: Normal range of motion.     Cervical back: Normal range of motion and neck supple.     Right lower leg: No edema.     Left lower leg: No edema.  Skin:    General: Skin is warm and dry.  Neurological:     General: No focal deficit present.     Mental Status: He is alert and oriented to person, place, and time.     Cranial Nerves: No cranial nerve deficit.     Motor: No weakness.     Gait: Gait normal.  Psychiatric:        Mood and Affect: Mood normal.        Behavior: Behavior normal.        Assessment & Plan:   Problem List Items Addressed This Visit   None Visit Diagnoses     Right sided abdominal pain    -  Primary   Chest pain, unspecified type       Relevant  Orders   EKG 12-Lead (Completed)       1. Right sided abdominal pain 2. Chest pain, unspecified type Discussed with patient that I think his chest pain is referred pain from his abdomen. EKG is in NSR, 68 bpm, no signs of ischemia. He is very tender along R side of abdomen. Recommend urgent eval for labs and imaging, need to r/o appendicitis / acute cholecystitis / other. Pt agreeable and safe for discharge to drive himself to ED.     Malaki Koury M Zaid Tomes, PA-C

## 2022-01-19 ENCOUNTER — Encounter: Payer: Self-pay | Admitting: *Deleted

## 2022-01-20 ENCOUNTER — Telehealth (HOSPITAL_COMMUNITY): Payer: BC Managed Care – PPO | Admitting: Psychiatry

## 2022-02-02 ENCOUNTER — Other Ambulatory Visit (HOSPITAL_COMMUNITY): Payer: Self-pay | Admitting: Psychiatry

## 2022-02-02 DIAGNOSIS — F319 Bipolar disorder, unspecified: Secondary | ICD-10-CM

## 2022-04-09 ENCOUNTER — Encounter: Payer: Self-pay | Admitting: *Deleted

## 2022-05-01 ENCOUNTER — Encounter: Payer: Self-pay | Admitting: Physician Assistant

## 2022-05-04 ENCOUNTER — Other Ambulatory Visit: Payer: Self-pay

## 2022-05-04 DIAGNOSIS — F1910 Other psychoactive substance abuse, uncomplicated: Secondary | ICD-10-CM

## 2022-05-04 DIAGNOSIS — F319 Bipolar disorder, unspecified: Secondary | ICD-10-CM

## 2022-05-04 MED ORDER — BUPROPION HCL ER (XL) 300 MG PO TB24: 300.0000 mg | ORAL_TABLET | ORAL | 0 refills | Status: DC

## 2022-05-27 ENCOUNTER — Ambulatory Visit (INDEPENDENT_AMBULATORY_CARE_PROVIDER_SITE_OTHER): Payer: Medicaid Other | Admitting: Physician Assistant

## 2022-05-27 ENCOUNTER — Encounter: Payer: Self-pay | Admitting: Physician Assistant

## 2022-05-27 VITALS — BP 112/64 | HR 85 | Temp 97.7°F | Ht 67.0 in | Wt 231.0 lb

## 2022-05-27 DIAGNOSIS — F1910 Other psychoactive substance abuse, uncomplicated: Secondary | ICD-10-CM | POA: Diagnosis not present

## 2022-05-27 DIAGNOSIS — F319 Bipolar disorder, unspecified: Secondary | ICD-10-CM | POA: Insufficient documentation

## 2022-05-27 MED ORDER — LAMOTRIGINE 25 MG PO TABS: 25.0000 mg | ORAL_TABLET | Freq: Two times a day (BID) | ORAL | 0 refills | Status: DC

## 2022-05-27 MED ORDER — LAMOTRIGINE 100 MG PO TABS: 100.0000 mg | ORAL_TABLET | Freq: Every day | ORAL | 0 refills | Status: DC

## 2022-05-27 NOTE — Progress Notes (Signed)
Subjective:    Patient ID: Jose Carr, male    DOB: February 12, 1991, 32 y.o.   MRN: [id number]  Chief Complaint  Patient presents with   Mental Health Concerns    Pt in office to discuss mental health; pt lost meds due to not having insurance, now got insurance back and needing to get medication refills; Wellbutrin has been refilled; not been sleeping, can't fall asleep or not staying asleep, anxiety came back being out of meds.    HPI Patient is in today for mental health concerns. Pt was seeing Dr. Berniece Andreas, says he missed an appointment due to poor internet connection. Then he had about 6 months without insurance. Hasn't had lamictal in the last three months, but it was definitely working and making a difference in his daily life. Heavy anxiety and worrying lately. Says he's not sleeping well this last week. Marijuana use daily, states it keeps him stable. Would like to quit smoking cigarettes at some point. Still reports good relationships with his family and child at this time.   Past Medical History:  Diagnosis Date   ADHD (attention deficit hyperactivity disorder)    Anxiety    Asthma    COPD (chronic obstructive pulmonary disease) ([place])    Depression    Substance abuse ([place])     Past Surgical History:  Procedure Laterality Date   CARPAL TUNNEL RELEASE     HAND SURGERY     HAND SURGERY     KNEE SURGERY     KNEE SURGERY      Family History  Problem Relation Age of Onset   COPD Father    Hypertension Father    Bipolar disorder Father    Schizophrenia Father    Bipolar disorder Sister    COPD Paternal Grandmother    COPD Paternal Grandfather     Social History   Tobacco Use   Smoking status: Every Day    Packs/day: 2.00    Types: Cigarettes   Smokeless tobacco: Never  Vaping Use   Vaping Use: Some days  Substance Use Topics   Alcohol use: Yes    Alcohol/week: 14.0 standard drinks of alcohol    Types: 14 Cans of beer per week    Comment: 2 tall boys after  work each morning   Drug use: Yes    Types: Marijuana    Comment: daily use     No Known Allergies  Review of Systems NEGATIVE UNLESS OTHERWISE INDICATED IN HPI      Objective:     BP 112/64 (BP Location: Left Arm)   Pulse 85   Temp 97.7 F (36.5 C) (Temporal)   Ht 5\' 7"  (1.702 m)   Wt 231 lb (104.8 kg)   SpO2 97%   BMI 36.18 kg/m   Wt Readings from Last 3 Encounters:  05/27/22 231 lb (104.8 kg)  11/21/21 238 lb (108 kg)  10/21/21 240 lb (108.9 kg)    BP Readings from Last 3 Encounters:  05/27/22 112/64  11/21/21 133/79  11/21/21 118/78     Physical Exam Vitals and nursing note reviewed.  Constitutional:      Appearance: Normal appearance. He is obese.  Cardiovascular:     Rate and Rhythm: Normal rate and regular rhythm.     Pulses: Normal pulses.     Heart sounds: No murmur heard. Pulmonary:     Effort: Pulmonary effort is normal.     Breath sounds: Normal breath sounds.  Neurological:  General: No focal deficit present.     Mental Status: He is alert.  Psychiatric:        Mood and Affect: Mood normal.        Behavior: Behavior normal.        Assessment & Plan:  Bipolar I disorder (HCC) -     lamoTRIgine; Take 1 tablet (100 mg total) by mouth daily.  Dispense: 90 tablet; Refill: 0  Substance abuse ([place])  Other orders -     lamoTRIgine; Take 1 tablet (25 mg total) by mouth 2 (two) times daily for 15 days.  Dispense: 30 tablet; Refill: 0   Due to life circumstances, patient has been without medication for quite some time.  His bipolar 1 disorder is not controlled at this time.  Plan to start him back on Lamictal 25 mg twice daily for 15 days, then he can increase back to the 100 mg once daily dosing that he was stable at.  He will also continue on Wellbutrin XL 300 mg. I have sent a message to Dr. Adele Schilder to see if we can get patient reestablished with him.  Patient also interested in smoking cessation, which I am happy to help him with, I  would like to get his mental health back to a stable level again first.  Patient denies any suicidal or homicidal ideations.  He knows about [facility] and the behavioral health urgent care center if ever needed.    Return in about 2 months (around 07/26/2022) for recheck.  This note was prepared with assistance of Systems analyst. Occasional wrong-word or sound-a-like substitutions may have occurred due to the inherent limitations of voice recognition software.     Dorsey Charette M Shaneeka Scarboro, PA-C

## 2022-07-28 ENCOUNTER — Ambulatory Visit: Payer: Medicaid Other | Admitting: Physician Assistant

## 2022-08-05 ENCOUNTER — Ambulatory Visit: Payer: Medicaid Other | Admitting: Physician Assistant

## 2022-08-14 ENCOUNTER — Other Ambulatory Visit: Payer: Self-pay | Admitting: Physician Assistant

## 2022-08-14 DIAGNOSIS — F319 Bipolar disorder, unspecified: Secondary | ICD-10-CM

## 2022-08-14 DIAGNOSIS — F1910 Other psychoactive substance abuse, uncomplicated: Secondary | ICD-10-CM

## 2022-08-14 MED ORDER — BUPROPION HCL ER (XL) 300 MG PO TB24: 300.0000 mg | ORAL_TABLET | ORAL | 0 refills | Status: DC

## 2022-08-24 ENCOUNTER — Ambulatory Visit: Payer: Medicaid Other | Admitting: Physician Assistant

## 2022-09-15 ENCOUNTER — Other Ambulatory Visit: Payer: Self-pay | Admitting: Physician Assistant

## 2022-09-15 DIAGNOSIS — F319 Bipolar disorder, unspecified: Secondary | ICD-10-CM

## 2022-09-18 ENCOUNTER — Other Ambulatory Visit: Payer: Self-pay | Admitting: Physician Assistant

## 2022-09-18 DIAGNOSIS — F319 Bipolar disorder, unspecified: Secondary | ICD-10-CM

## 2022-09-19 ENCOUNTER — Other Ambulatory Visit: Payer: Self-pay | Admitting: Physician Assistant

## 2022-09-19 DIAGNOSIS — F319 Bipolar disorder, unspecified: Secondary | ICD-10-CM

## 2022-09-19 DIAGNOSIS — F1910 Other psychoactive substance abuse, uncomplicated: Secondary | ICD-10-CM

## 2022-09-27 ENCOUNTER — Other Ambulatory Visit: Payer: Self-pay | Admitting: Physician Assistant

## 2022-09-27 DIAGNOSIS — F1910 Other psychoactive substance abuse, uncomplicated: Secondary | ICD-10-CM

## 2022-09-27 DIAGNOSIS — F319 Bipolar disorder, unspecified: Secondary | ICD-10-CM

## 2022-09-28 ENCOUNTER — Encounter: Payer: Self-pay | Admitting: Physician Assistant

## 2022-10-07 ENCOUNTER — Ambulatory Visit (INDEPENDENT_AMBULATORY_CARE_PROVIDER_SITE_OTHER): Payer: Medicaid Other | Admitting: Physician Assistant

## 2022-10-07 VITALS — BP 126/76 | HR 66 | Temp 97.8°F | Ht 67.0 in | Wt 214.4 lb

## 2022-10-07 DIAGNOSIS — F1910 Other psychoactive substance abuse, uncomplicated: Secondary | ICD-10-CM | POA: Diagnosis not present

## 2022-10-07 DIAGNOSIS — F319 Bipolar disorder, unspecified: Secondary | ICD-10-CM | POA: Diagnosis not present

## 2022-10-07 DIAGNOSIS — R7989 Other specified abnormal findings of blood chemistry: Secondary | ICD-10-CM

## 2022-10-07 DIAGNOSIS — Z79899 Other long term (current) drug therapy: Secondary | ICD-10-CM | POA: Insufficient documentation

## 2022-10-07 DIAGNOSIS — R1031 Right lower quadrant pain: Secondary | ICD-10-CM

## 2022-10-07 LAB — COMPREHENSIVE METABOLIC PANEL
ALT: 13 U/L (ref 0–53)
AST: 17 U/L (ref 0–37)
Albumin: 4.1 g/dL (ref 3.5–5.2)
Alkaline Phosphatase: 53 U/L (ref 39–117)
BUN: 19 mg/dL (ref 6–23)
CO2: 26 mEq/L (ref 19–32)
Calcium: 9.1 mg/dL (ref 8.4–10.5)
Chloride: 106 mEq/L (ref 96–112)
Creatinine, Ser: 1.09 mg/dL (ref 0.40–1.50)
GFR: 88.85 mL/min (ref >60.00)
Glucose, Bld: 125 mg/dL — ABNORMAL HIGH (ref 70–99)
Potassium: 3.5 mEq/L (ref 3.5–5.1)
Sodium: 141 mEq/L (ref 135–145)
Total Bilirubin: 0.3 mg/dL (ref 0.2–1.2)
Total Protein: 6.2 g/dL (ref 6.0–8.3)

## 2022-10-07 LAB — POC URINALSYSI DIPSTICK (AUTOMATED)
Bilirubin, UA: POSITIVE
Blood, UA: NEGATIVE
Glucose, UA: NEGATIVE
Ketones, UA: POSITIVE
Leukocytes, UA: NEGATIVE
Nitrite, UA: NEGATIVE
Protein, UA: POSITIVE — AB
Spec Grav, UA: >=1.03 — AB (ref 1.010–1.025)
Urobilinogen, UA: 0.2 E.U./dL
pH, UA: 6 (ref 5.0–8.0)

## 2022-10-07 LAB — TSH: TSH: 3.29 u[IU]/mL (ref 0.35–5.50)

## 2022-10-07 LAB — CBC WITH DIFFERENTIAL/PLATELET
Basophils Absolute: 0.1 10*3/uL (ref 0.0–0.1)
Basophils Relative: 0.8 % (ref 0.0–3.0)
Eosinophils Absolute: 0.6 10*3/uL (ref 0.0–0.7)
Eosinophils Relative: 6.8 % — ABNORMAL HIGH (ref 0.0–5.0)
HCT: 44.1 % (ref 39.0–52.0)
Hemoglobin: 14.6 g/dL (ref 13.0–17.0)
Lymphocytes Relative: 29 % (ref 12.0–46.0)
Lymphs Abs: 2.5 10*3/uL (ref 0.7–4.0)
MCHC: 33.2 g/dL (ref 30.0–36.0)
MCV: 89.4 fl (ref 78.0–100.0)
Monocytes Absolute: 0.7 10*3/uL (ref 0.1–1.0)
Monocytes Relative: 7.6 % (ref 3.0–12.0)
Neutro Abs: 4.9 10*3/uL (ref 1.4–7.7)
Neutrophils Relative %: 55.8 % (ref 43.0–77.0)
Platelets: 242 10*3/uL (ref 150.0–400.0)
RBC: 4.93 Mil/uL (ref 4.22–5.81)
RDW: 13.1 % (ref 11.5–15.5)
WBC: 8.8 10*3/uL (ref 4.0–10.5)

## 2022-10-07 MED ORDER — LAMOTRIGINE 100 MG PO TABS: 100.0000 mg | ORAL_TABLET | Freq: Every day | ORAL | 1 refills | Status: DC

## 2022-10-07 MED ORDER — BUPROPION HCL ER (XL) 300 MG PO TB24: 300.0000 mg | ORAL_TABLET | ORAL | 1 refills | Status: DC

## 2022-10-07 NOTE — Assessment & Plan Note (Signed)
Lamictal - doing well, check labs today.

## 2022-10-07 NOTE — Assessment & Plan Note (Signed)
Chronic, stable with Lamictal 100 mg qd and Wellbutrin XL 300 mg qd Refilled today Recheck labs

## 2022-10-07 NOTE — Progress Notes (Signed)
Subjective:    Patient ID: Jose Carr, male    DOB: [birth year], 32 y.o.   MRN: [id number]  Chief Complaint  Patient presents with   Medication Refill    Pt in for appt for medication refills; overall pt is well; pt has trouble falling asleep at times, a little manic at times per patient. Just brain not shutting down and racing thoughts.     Medication Refill   Patient is in today for recheck. See A/P.   Past Medical History:  Diagnosis Date   ADHD (attention deficit hyperactivity disorder)    Anxiety    Asthma    COPD (chronic obstructive pulmonary disease) (HCC)    Depression    Substance abuse (HCC)     Past Surgical History:  Procedure Laterality Date   CARPAL TUNNEL RELEASE     HAND SURGERY     HAND SURGERY     KNEE SURGERY     KNEE SURGERY      Family History  Problem Relation Age of Onset   COPD Father    Hypertension Father    Bipolar disorder Father    Schizophrenia Father    Bipolar disorder Sister    COPD Paternal Grandmother    COPD Paternal Grandfather     Social History   Tobacco Use   Smoking status: Every Day    Packs/day: 2    Types: Cigarettes   Smokeless tobacco: Never  Vaping Use   Vaping Use: Some days  Substance Use Topics   Alcohol use: Yes    Alcohol/week: 14.0 standard drinks of alcohol    Types: 14 Cans of beer per week    Comment: 2 tall boys after work each morning   Drug use: Yes    Types: Marijuana    Comment: daily use     No Known Allergies  Review of Systems NEGATIVE UNLESS OTHERWISE INDICATED IN HPI      Objective:     BP 126/76 (BP Location: Left Arm)   Pulse 66   Temp 97.8 F (36.6 C) (Temporal)   Ht 5\' 7"  (1.702 m)   Wt 214 lb 6.4 oz (97.3 kg)   SpO2 96%   BMI 33.58 kg/m   Wt Readings from Last 3 Encounters:  10/07/22 214 lb 6.4 oz (97.3 kg)  05/27/22 231 lb (104.8 kg)  11/21/21 238 lb (108 kg)    BP Readings from Last 3 Encounters:  10/07/22 126/76  05/27/22 112/64  11/21/21 133/79      Physical Exam Vitals and nursing note reviewed.  Constitutional:      General: He is not in acute distress.    Appearance: Normal appearance. He is not toxic-appearing.  HENT:     Head: Normocephalic and atraumatic.  Eyes:     Extraocular Movements: Extraocular movements intact.     Conjunctiva/sclera: Conjunctivae normal.     Pupils: Pupils are equal, round, and reactive to light.  Cardiovascular:     Rate and Rhythm: Normal rate and regular rhythm.     Pulses: Normal pulses.     Heart sounds: Normal heart sounds.  Pulmonary:     Effort: Pulmonary effort is normal.     Breath sounds: Normal breath sounds.  Abdominal:     General: Abdomen is flat. Bowel sounds are normal.     Palpations: Abdomen is soft.     Tenderness: There is abdominal tenderness (Right lower quad, minimal TTP).  Musculoskeletal:  General: Normal range of motion.     Cervical back: Normal range of motion and neck supple.     Right lower leg: No edema.     Left lower leg: No edema.  Skin:    General: Skin is warm and dry.  Neurological:     General: No focal deficit present.     Mental Status: He is alert and oriented to person, place, and time.     Cranial Nerves: No cranial nerve deficit.     Motor: No weakness.     Gait: Gait normal.  Psychiatric:        Mood and Affect: Mood normal.        Behavior: Behavior normal.        Assessment & Plan:  Bipolar I disorder (HCC) Assessment & Plan: Chronic, stable with Lamictal 100 mg qd and Wellbutrin XL 300 mg qd Refilled today Recheck labs   Orders: -     lamoTRIgine; Take 1 tablet (100 mg total) by mouth daily.  Dispense: 90 tablet; Refill: 1 -     buPROPion HCl ER (XL); Take 1 tablet (300 mg total) by mouth every morning.  Dispense: 90 tablet; Refill: 1 -     CBC with Differential/Platelet -     Comprehensive metabolic panel -     TSH  Substance abuse (HCC) Assessment & Plan: Encouraged cessation of polysubstance  Orders: -      buPROPion HCl ER (XL); Take 1 tablet (300 mg total) by mouth every morning.  Dispense: 90 tablet; Refill: 1 -     CBC with Differential/Platelet -     Comprehensive metabolic panel -     TSH  High risk medication use Assessment & Plan: Lamictal - doing well, check labs today.   Orders: -     CBC with Differential/Platelet -     Comprehensive metabolic panel -     TSH  Right lower quadrant pain -     POCT Urinalysis Dipstick (Automated) -     US Abdomen Complete; Future -     US PELVIS LIMITED (TRANSABDOMINAL ONLY); Future         Return in about 6 months (around 04/08/2023) for recheck/follow-up.     Davy Faught M Kevonna Nolte, PA-C

## 2022-10-07 NOTE — Addendum Note (Signed)
Addended by: Ila Mcgill on: 10/07/2022 11:42 AM   Modules accepted: Orders

## 2022-10-07 NOTE — Assessment & Plan Note (Signed)
Encouraged cessation of polysubstance

## 2023-03-22 ENCOUNTER — Other Ambulatory Visit: Payer: Self-pay | Admitting: Physician Assistant

## 2023-03-22 DIAGNOSIS — F319 Bipolar disorder, unspecified: Secondary | ICD-10-CM

## 2023-03-22 DIAGNOSIS — F1910 Other psychoactive substance abuse, uncomplicated: Secondary | ICD-10-CM

## 2023-03-23 ENCOUNTER — Ambulatory Visit: Payer: Medicaid Other | Admitting: Physician Assistant

## 2023-04-02 ENCOUNTER — Other Ambulatory Visit: Payer: Self-pay | Admitting: Physician Assistant

## 2023-04-02 DIAGNOSIS — F319 Bipolar disorder, unspecified: Secondary | ICD-10-CM

## 2023-04-05 ENCOUNTER — Ambulatory Visit (INDEPENDENT_AMBULATORY_CARE_PROVIDER_SITE_OTHER): Payer: Medicaid Other | Admitting: Physician Assistant

## 2023-04-05 VITALS — BP 124/70 | HR 87 | Temp 97.7°F | Resp 16 | Ht 67.0 in | Wt 207.0 lb

## 2023-04-05 DIAGNOSIS — F319 Bipolar disorder, unspecified: Secondary | ICD-10-CM | POA: Diagnosis not present

## 2023-04-05 DIAGNOSIS — F419 Anxiety disorder, unspecified: Secondary | ICD-10-CM | POA: Diagnosis not present

## 2023-04-05 DIAGNOSIS — F1910 Other psychoactive substance abuse, uncomplicated: Secondary | ICD-10-CM

## 2023-04-05 DIAGNOSIS — F32A Depression, unspecified: Secondary | ICD-10-CM | POA: Insufficient documentation

## 2023-04-05 DIAGNOSIS — F17200 Nicotine dependence, unspecified, uncomplicated: Secondary | ICD-10-CM

## 2023-04-05 DIAGNOSIS — Z23 Encounter for immunization: Secondary | ICD-10-CM | POA: Diagnosis not present

## 2023-04-05 MED ORDER — NICOTINE 21 MG/24HR TD PT24: 21.0000 mg | MEDICATED_PATCH | Freq: Every day | TRANSDERMAL | 1 refills | Status: DC

## 2023-04-05 MED ORDER — BUPROPION HCL ER (XL) 300 MG PO TB24: 300.0000 mg | ORAL_TABLET | Freq: Every day | ORAL | 1 refills | Status: DC

## 2023-04-05 MED ORDER — LAMOTRIGINE 100 MG PO TABS: 100.0000 mg | ORAL_TABLET | Freq: Two times a day (BID) | ORAL | 2 refills | Status: DC

## 2023-04-05 NOTE — Progress Notes (Signed)
Patient ID: Jose Carr, male    DOB: 1990-08-11, 32 y.o.   MRN: [id number]   Assessment & Plan:  Anxiety and depression -     buPROPion HCl ER (XL); Take 1 tablet (300 mg total) by mouth daily.  Dispense: 90 tablet; Refill: 1  Bipolar I disorder (HCC) -     lamoTRIgine; Take 1 tablet (100 mg total) by mouth 2 (two) times daily.  Dispense: 60 tablet; Refill: 2 -     buPROPion HCl ER (XL); Take 1 tablet (300 mg total) by mouth daily.  Dispense: 90 tablet; Refill: 1  Substance abuse (HCC) -     lamoTRIgine; Take 1 tablet (100 mg total) by mouth 2 (two) times daily.  Dispense: 60 tablet; Refill: 2 -     buPROPion HCl ER (XL); Take 1 tablet (300 mg total) by mouth daily.  Dispense: 90 tablet; Refill: 1  Smoking addiction -     Nicotine; Place 1 patch (21 mg total) onto the skin daily. Use this dose for 6 weeks, then proceed to Step 2.  Dispense: 28 patch; Refill: 1  Need for Tdap vaccination    Assessment and Plan    Bipolar Disorder Reports increased anxiety, irritability, and panic attacks. Mood swings noted with increased irritability and suppressed rage. Reports difficulty sleeping. Medication adherence is good with Wellbutrin 300mg  and Lamotrigine 100mg  daily. -Increase Lamotrigine to 100mg  twice daily. -Reevaluate in 6-8 weeks.  Nicotine Dependence Currently smoking 1 pack per day. Expressed interest in quitting and has tried vaping and cessation without success. -Start Nicotine patch therapy with 21mg  for 6 weeks, then step down as indicated. -Consider nicotine gum for oral fixation.  Dental Pain Reports tooth pain due to a broken tooth. Inquired about dentists who accept Medicaid. -Provided information on potential dental clinics accepting Medicaid.  Follow-up in 6-8 weeks to assess response to increased Lamotrigine dose and progress with nicotine patch therapy.          Return in about 6 weeks (around 05/17/2023) for recheck/follow-up.    Subjective:     Chief Complaint  Patient presents with   Anxiety    Here for follow up   Depression    Here for follow up    Anxiety    Depression        Past medical history includes anxiety.    Discussed the use of AI scribe software for clinical note transcription with the patient, who gave verbal consent to proceed.  History of Present Illness   The patient, with a history of mental health issues managed with Wellbutrin and Lamotrigine, presents with increased anxiety and panic. He attributes these symptoms to recent life changes, including a divorce. The patient also reports increased irritability and suppressed rage, which he believes contributes to his anxiety. He expresses satisfaction with his current medications but is open to adjustments to better manage his symptoms.  The patient also expresses a desire to quit smoking and is interested in using a nicotine patch as a cessation aid. He reports a current smoking habit of about a pack a day.  Additionally, the patient reports a broken tooth and is seeking a dentist who accepts Medicaid. He describes intermittent severe tooth pain, particularly with cold drinks.       Past Medical History:  Diagnosis Date   ADHD (attention deficit hyperactivity disorder)    Anxiety    Asthma    COPD (chronic obstructive pulmonary disease) (HCC)    Depression  Substance abuse (HCC)     Past Surgical History:  Procedure Laterality Date   CARPAL TUNNEL RELEASE     HAND SURGERY     HAND SURGERY     KNEE SURGERY     KNEE SURGERY      Family History  Problem Relation Age of Onset   COPD Father    Hypertension Father    Bipolar disorder Father    Schizophrenia Father    Bipolar disorder Sister    COPD Paternal Grandmother    COPD Paternal Grandfather     Social History   Tobacco Use   Smoking status: Every Day    Current packs/day: 2.00    Types: Cigarettes   Smokeless tobacco: Never  Vaping Use   Vaping status: Some Days   Substance Use Topics   Alcohol use: Yes    Alcohol/week: 14.0 standard drinks of alcohol    Types: 14 Cans of beer per week    Comment: 2 tall boys after work each morning   Drug use: Yes    Types: Marijuana    Comment: daily use     No Known Allergies  Review of Systems  Psychiatric/Behavioral:  Positive for depression.    NEGATIVE UNLESS OTHERWISE INDICATED IN HPI      Objective:     BP 124/70 (BP Location: Right Arm, Patient Position: Sitting, Cuff Size: Normal)   Pulse 87   Temp 97.7 F (36.5 C) (Temporal)   Resp 16   Ht 5\' 7"  (1.702 m)   Wt 207 lb (93.9 kg)   SpO2 96%   BMI 32.42 kg/m   Wt Readings from Last 3 Encounters:  04/05/23 207 lb (93.9 kg)  10/07/22 214 lb 6.4 oz (97.3 kg)  05/27/22 231 lb (104.8 kg)    BP Readings from Last 3 Encounters:  04/05/23 124/70  10/07/22 126/76  05/27/22 112/64     Physical Exam Vitals and nursing note reviewed.  Constitutional:      General: He is not in acute distress.    Appearance: Normal appearance. He is not ill-appearing.  HENT:     Head: Normocephalic.     Right Ear: External ear normal.     Left Ear: External ear normal.     Nose: No congestion.     Mouth/Throat:     Mouth: Mucous membranes are moist.     Pharynx: No oropharyngeal exudate or posterior oropharyngeal erythema.  Eyes:     Extraocular Movements: Extraocular movements intact.     Conjunctiva/sclera: Conjunctivae normal.     Pupils: Pupils are equal, round, and reactive to light.  Cardiovascular:     Rate and Rhythm: Normal rate and regular rhythm.     Pulses: Normal pulses.     Heart sounds: Normal heart sounds. No murmur heard. Pulmonary:     Effort: Pulmonary effort is normal. No respiratory distress.     Breath sounds: Normal breath sounds. No wheezing.  Musculoskeletal:     Cervical back: Normal range of motion.  Skin:    General: Skin is warm.     Findings: No rash.  Neurological:     Mental Status: He is alert and oriented  to person, place, and time.  Psychiatric:        Mood and Affect: Mood normal.        Behavior: Behavior normal.           [name], PA-C

## 2023-05-17 ENCOUNTER — Ambulatory Visit: Payer: Medicaid Other | Admitting: Physician Assistant

## 2023-06-01 ENCOUNTER — Ambulatory Visit: Payer: Medicaid Other | Admitting: Physician Assistant

## 2023-06-01 DIAGNOSIS — F1910 Other psychoactive substance abuse, uncomplicated: Secondary | ICD-10-CM | POA: Diagnosis not present

## 2023-06-01 DIAGNOSIS — F419 Anxiety disorder, unspecified: Secondary | ICD-10-CM

## 2023-06-01 DIAGNOSIS — F32A Depression, unspecified: Secondary | ICD-10-CM | POA: Diagnosis not present

## 2023-06-01 DIAGNOSIS — F319 Bipolar disorder, unspecified: Secondary | ICD-10-CM

## 2023-06-01 MED ORDER — NICOTINE 14 MG/24HR TD PT24: 14.0000 mg | MEDICATED_PATCH | Freq: Every day | TRANSDERMAL | 0 refills | Status: AC

## 2023-06-01 MED ORDER — LAMOTRIGINE 100 MG PO TABS: 100.0000 mg | ORAL_TABLET | Freq: Two times a day (BID) | ORAL | 2 refills | Status: DC

## 2023-06-01 MED ORDER — BUPROPION HCL ER (XL) 300 MG PO TB24: 300.0000 mg | ORAL_TABLET | Freq: Every day | ORAL | 1 refills | Status: AC

## 2023-06-01 NOTE — Progress Notes (Signed)
 Patient ID: Jose Carr, male    DOB: [birth year], 33 y.o.   MRN: [id number]   Assessment & Plan:  Bipolar I disorder (HCC) -     lamoTRIgine ; Take 1 tablet (100 mg total) by mouth 2 (two) times daily.  Dispense: 60 tablet; Refill: 2 -     buPROPion  HCl ER (XL); Take 1 tablet (300 mg total) by mouth daily.  Dispense: 90 tablet; Refill: 1  Substance abuse (HCC) -     lamoTRIgine ; Take 1 tablet (100 mg total) by mouth 2 (two) times daily.  Dispense: 60 tablet; Refill: 2 -     buPROPion  HCl ER (XL); Take 1 tablet (300 mg total) by mouth daily.  Dispense: 90 tablet; Refill: 1  Anxiety and depression -     buPROPion  HCl ER (XL); Take 1 tablet (300 mg total) by mouth daily.  Dispense: 90 tablet; Refill: 1  Other orders -     Nicotine ; Place 1 patch (14 mg total) onto the skin daily.  Dispense: 28 patch; Refill: 0   Assessment and Plan    Tobacco Use Disorder Reduced smoking to 5 cigarettes per day and started vaping. Reported some irritability and mania with 21mg  Nicoderm patch. No rash except in one location. -Continue efforts to quit smoking. -Consider using a 14mg  Nicoderm patch to see if it reduces irritability and mania.  Bipolar Disorder Some irritability and inconsistent use of Lamictal . -Encouraged to take Lamictal  consistently. -Refilled Lamictal  and Wellbutrin  prescriptions.  Unintentional Weight Loss Lost more weight, not trying to lose weight. Reports feeling fine and no alarming symptoms. -Continue to monitor weight. -Return for follow-up in June.  Influenza Vaccination Has not received flu shot this season. -Consider receiving flu shot today.  General Health Maintenance -Encouraged to maintain healthy lifestyle changes including diet and exercise. -Return for follow-up in June to check weight and smoking status.           Return in about 4 months (around 09/29/2023) for recheck/follow-up.    Subjective:    Chief Complaint  Patient presents with    Medical Management of Chronic Issues    Pt in office for follow up and medication refills;     HPI   History of Present Illness   Jose Carr, a patient with a history of smoking and anxiety, presents with concerns about recent weight loss and ongoing anxiety. He reports a decrease in appetite and a busy schedule due to work and family responsibilities. He also mentions feeling lethargic and tired during the day. Despite these symptoms, the patient states he feels physically fine.  The patient has been trying to quit smoking using Nicoderm patches but reports some side effects including itchiness and redness at the application site, and a feeling of mania. He has reduced his smoking to about five cigarettes a day and has started vaping as a supplement. He expresses a desire to quit smoking completely before the birth of his girlfriend's baby in July.  The patient also reports some difficulty in consistently taking his second dose of Lamictal  for anxiety, which he believes is contributing to his irritability. He mentions that on the days he takes the medication regularly, he feels great. He also takes melatonin at night to help with sleep.  In his personal life, the patient shares that his girlfriend is pregnant and he is excited but nervous about the upcoming addition to their family. He also mentions that his son was recently hospitalized for asthma and respiratory issues.  Past Medical History:  Diagnosis Date   ADHD (attention deficit hyperactivity disorder)    Anxiety    Asthma    COPD (chronic obstructive pulmonary disease) (HCC)    Depression    Substance abuse (HCC)     Past Surgical History:  Procedure Laterality Date   CARPAL TUNNEL RELEASE     HAND SURGERY     HAND SURGERY     KNEE SURGERY     KNEE SURGERY      Family History  Problem Relation Age of Onset   COPD Father    Hypertension Father    Bipolar disorder Father    Schizophrenia Father    Bipolar disorder  Sister    COPD Paternal Grandmother    COPD Paternal Grandfather     Social History   Tobacco Use   Smoking status: Every Day    Current packs/day: 2.00    Types: Cigarettes   Smokeless tobacco: Never  Vaping Use   Vaping status: Some Days  Substance Use Topics   Alcohol use: Yes    Alcohol/week: 14.0 standard drinks of alcohol    Types: 14 Cans of beer per week    Comment: 2 tall boys after work each morning   Drug use: Yes    Types: Marijuana    Comment: daily use     No Known Allergies  Review of Systems NEGATIVE UNLESS OTHERWISE INDICATED IN HPI      Objective:     BP 110/70 (BP Location: Left Arm, Patient Position: Sitting)   Pulse 79   Temp 98.4 F (36.9 C) (Temporal)   Ht 5' 7 (1.702 m)   Wt 199 lb 9.6 oz (90.5 kg)   SpO2 97%   BMI 31.26 kg/m   Wt Readings from Last 3 Encounters:  06/01/23 199 lb 9.6 oz (90.5 kg)  04/05/23 207 lb (93.9 kg)  10/07/22 214 lb 6.4 oz (97.3 kg)    BP Readings from Last 3 Encounters:  06/01/23 110/70  04/05/23 124/70  10/07/22 126/76     Physical Exam Vitals and nursing note reviewed.  Constitutional:      General: He is not in acute distress.    Appearance: Normal appearance. He is not ill-appearing.  HENT:     Head: Normocephalic.     Right Ear: External ear normal.     Left Ear: External ear normal.     Nose: No congestion.     Mouth/Throat:     Mouth: Mucous membranes are moist.     Pharynx: No oropharyngeal exudate or posterior oropharyngeal erythema.  Eyes:     Extraocular Movements: Extraocular movements intact.     Conjunctiva/sclera: Conjunctivae normal.     Pupils: Pupils are equal, round, and reactive to light.  Cardiovascular:     Rate and Rhythm: Normal rate and regular rhythm.     Pulses: Normal pulses.     Heart sounds: Normal heart sounds. No murmur heard. Pulmonary:     Effort: Pulmonary effort is normal. No respiratory distress.     Breath sounds: Normal breath sounds. No wheezing.   Musculoskeletal:     Cervical back: Normal range of motion.  Skin:    General: Skin is warm.     Findings: No rash.  Neurological:     Mental Status: He is alert and oriented to person, place, and time.  Psychiatric:        Mood and Affect: Mood normal.        Behavior:  Behavior normal.        Shakesha Soltau M Naziya Hegwood, PA-C

## 2023-06-18 ENCOUNTER — Encounter: Payer: Self-pay | Admitting: Physician Assistant

## 2023-06-28 ENCOUNTER — Other Ambulatory Visit: Payer: Self-pay | Admitting: Physician Assistant

## 2023-06-28 DIAGNOSIS — F1910 Other psychoactive substance abuse, uncomplicated: Secondary | ICD-10-CM

## 2023-06-28 DIAGNOSIS — F319 Bipolar disorder, unspecified: Secondary | ICD-10-CM

## 2023-06-28 DIAGNOSIS — F32A Depression, unspecified: Secondary | ICD-10-CM

## 2023-07-15 ENCOUNTER — Encounter: Payer: Self-pay | Admitting: Physician Assistant

## 2023-07-26 ENCOUNTER — Ambulatory Visit (INDEPENDENT_AMBULATORY_CARE_PROVIDER_SITE_OTHER)

## 2023-07-26 ENCOUNTER — Ambulatory Visit (INDEPENDENT_AMBULATORY_CARE_PROVIDER_SITE_OTHER): Admitting: Physician Assistant

## 2023-07-26 ENCOUNTER — Encounter: Payer: Self-pay | Admitting: Physician Assistant

## 2023-07-26 VITALS — BP 126/74 | HR 75 | Temp 98.2°F | Ht 67.0 in | Wt 197.0 lb

## 2023-07-26 DIAGNOSIS — J4521 Mild intermittent asthma with (acute) exacerbation: Secondary | ICD-10-CM

## 2023-07-26 DIAGNOSIS — F1910 Other psychoactive substance abuse, uncomplicated: Secondary | ICD-10-CM | POA: Diagnosis not present

## 2023-07-26 DIAGNOSIS — R0789 Other chest pain: Secondary | ICD-10-CM

## 2023-07-26 DIAGNOSIS — F419 Anxiety disorder, unspecified: Secondary | ICD-10-CM

## 2023-07-26 DIAGNOSIS — R5383 Other fatigue: Secondary | ICD-10-CM | POA: Diagnosis not present

## 2023-07-26 DIAGNOSIS — Z79899 Other long term (current) drug therapy: Secondary | ICD-10-CM | POA: Diagnosis not present

## 2023-07-26 DIAGNOSIS — F319 Bipolar disorder, unspecified: Secondary | ICD-10-CM | POA: Diagnosis not present

## 2023-07-26 DIAGNOSIS — F32A Depression, unspecified: Secondary | ICD-10-CM

## 2023-07-26 LAB — TSH: TSH: 2.28 u[IU]/mL (ref 0.35–5.50)

## 2023-07-26 LAB — COMPREHENSIVE METABOLIC PANEL WITH GFR
ALT: 16 U/L (ref 0–53)
AST: 17 U/L (ref 0–37)
Albumin: 4.5 g/dL (ref 3.5–5.2)
Alkaline Phosphatase: 60 U/L (ref 39–117)
BUN: 16 mg/dL (ref 6–23)
CO2: 27 meq/L (ref 19–32)
Calcium: 9.3 mg/dL (ref 8.4–10.5)
Chloride: 104 meq/L (ref 96–112)
Creatinine, Ser: 0.92 mg/dL (ref 0.40–1.50)
GFR: 108.29 mL/min (ref >60.00)
Glucose, Bld: 89 mg/dL (ref 70–99)
Potassium: 4 meq/L (ref 3.5–5.1)
Sodium: 140 meq/L (ref 135–145)
Total Bilirubin: 0.4 mg/dL (ref 0.2–1.2)
Total Protein: 7.1 g/dL (ref 6.0–8.3)

## 2023-07-26 LAB — CBC WITH DIFFERENTIAL/PLATELET
Basophils Absolute: 0.1 10*3/uL (ref 0.0–0.1)
Basophils Relative: 0.9 % (ref 0.0–3.0)
Eosinophils Absolute: 0.4 10*3/uL (ref 0.0–0.7)
Eosinophils Relative: 4.8 % (ref 0.0–5.0)
HCT: 44 % (ref 39.0–52.0)
Hemoglobin: 15 g/dL (ref 13.0–17.0)
Lymphocytes Relative: 17.6 % (ref 12.0–46.0)
Lymphs Abs: 1.5 10*3/uL (ref 0.7–4.0)
MCHC: 34.2 g/dL (ref 30.0–36.0)
MCV: 90.3 fl (ref 78.0–100.0)
Monocytes Absolute: 0.7 10*3/uL (ref 0.1–1.0)
Monocytes Relative: 7.8 % (ref 3.0–12.0)
Neutro Abs: 6 10*3/uL (ref 1.4–7.7)
Neutrophils Relative %: 68.9 % (ref 43.0–77.0)
Platelets: 227 10*3/uL (ref 150.0–400.0)
RBC: 4.87 Mil/uL (ref 4.22–5.81)
RDW: 13.2 % (ref 11.5–15.5)
WBC: 8.8 10*3/uL (ref 4.0–10.5)

## 2023-07-26 LAB — HEMOGLOBIN A1C: Hgb A1c MFr Bld: 5.4 % (ref 4.6–6.5)

## 2023-07-26 MED ORDER — FLUTICASONE PROPIONATE HFA 110 MCG/ACT IN AERO: 1.0000 | INHALATION_SPRAY | Freq: Two times a day (BID) | RESPIRATORY_TRACT | 5 refills | Status: DC

## 2023-07-26 MED ORDER — BACLOFEN 5 MG PO TABS: 1.0000 | ORAL_TABLET | Freq: Two times a day (BID) | ORAL | 0 refills | Status: AC

## 2023-07-26 MED ORDER — LAMOTRIGINE 25 MG PO TABS: ORAL_TABLET | ORAL | 0 refills | Status: DC

## 2023-07-26 NOTE — Patient Instructions (Addendum)
 Labs and Chest XRAY today  Start on Flovent 1 puff twice daily Use rescue inhaler four times daily (for at least the next 5-7 days)  Continue Lamictal 100 mg twice daily; add lamictal 25 mg to the morning dose Take Baclofen 5 mg twice daily to help with muscle relaxation and anxiety  Referral to [facility]  If you develop suicidal thoughts, please tell someone and immediately proceed to our local 24/7 crisis center, [facility].[address], [place], [place] [phone].

## 2023-07-26 NOTE — Progress Notes (Signed)
 Patient ID: Jose Carr, male    DOB: 06-17-1990, 33 y.o.   MRN: [id number]   Assessment & Plan:  Chest tightness -     DG Chest 2 View; Future -     CBC with Differential/Platelet -     Comprehensive metabolic panel with GFR -     TSH -     Hemoglobin A1c -     Lamotrigine level  Mild intermittent asthma with (acute) exacerbation -     DG Chest 2 View; Future  Bipolar I disorder (HCC) -     Ambulatory referral to Psychiatry  Substance abuse (HCC) -     Ambulatory referral to Psychiatry  Anxiety and depression -     Ambulatory referral to Psychiatry  High risk medication use -     CBC with Differential/Platelet -     Comprehensive metabolic panel with GFR -     TSH -     Hemoglobin A1c -     Lamotrigine level -     Ambulatory referral to Psychiatry  Other fatigue -     CBC with Differential/Platelet -     Comprehensive metabolic panel with GFR -     TSH -     Hemoglobin A1c -     Lamotrigine level  Other orders -     Fluticasone Propionate HFA; Inhale 1 puff into the lungs 2 (two) times daily. Rinse mouth after use.  Dispense: 1 each; Refill: 5 -     lamoTRIgine; Take one tab po with AM dose of lamictal 100 mg tablet.  Dispense: 30 tablet; Refill: 0 -     Baclofen; Take 1 tablet (5 mg total) by mouth 2 (two) times daily for 15 days.  Dispense: 30 tablet; Refill: 0      Assessment and Plan Assessment & Plan Anxiety and Bipolar Disorder Victory experiences increased anxiety and symptoms consistent with bipolar disorder, including mood instability, irritability, and panic attacks. His current medication regimen, including Lamictal and Wellbutrin, may not be effectively managing his symptoms. He is under significant stress due to personal and work-related issues, including a traumatic event at work and relationship stressors. He is hesitant about traditional psychiatric interventions but is open to exploring options focusing on current and future well-being. The  possibility of increasing Lamictal dosage was considered, and a referral to [facility] for further assessment and potential cognitive behavioral therapy was made. - Continue Lamictal 100 mg twice daily. - Add Lamictal 25 mg to the morning dose. - Refer to [facility] for further assessment and potential cognitive behavioral therapy. - Discuss strict ER precautions for worsening symptoms. - He denies any drug or alcohol use.   Chest Pain and Rib Swelling Jose Carr reports swelling and discomfort in the left lower rib area, with noticeable bruising. The pain is not severe but persistent. Symptoms may be related to anatomical or musculoskeletal issues rather than cardiac problems, given his history and presentation. However, due to family history of cardiac issues, further investigation is warranted. - Order chest x-ray to evaluate the chest and rib area. - Perform laboratory tests including TSH, A1c, CMP, and CBC to rule out metabolic causes.  Asthma Jose Carr has asthma, which may be exacerbated by recent smoking habits and environmental factors. He reports increased wheezing and chest tightness, which could be contributing to his anxiety. Pronounced wheezing was noted during the examination. The potential role of asthma in his current symptoms was discussed, and the initiation of  Flovent was recommended. - Start Flovent, one puff twice daily, and instruct to rinse mouth after use. - Consider a steroid shot to manage acute symptoms if necessary.  General Health Maintenance Jose Carr is attempting to reduce smoking and has switched to natural cigarettes. He is managing stress and lifestyle factors that may impact his health. Smoking cessation aids were considered, but he reported adverse effects with nicotine patches. - Encourage continued reduction in smoking and consider smoking cessation aids as appropriate.    Flowsheet Row Office Visit from 07/26/2023 in [facility]  [facility] at [facility] Total Score 17         07/26/2023    1:52 PM 06/01/2023    1:02 PM 04/05/2023   11:08 AM 10/07/2022    8:27 AM  GAD 7 : Generalized Anxiety Score  Nervous, Anxious, on Edge 3 2 2 1   Control/stop worrying 3 1 1 1   Worry too much - different things 3 1 1  0  Trouble relaxing 3 0 1 0  Restless 1 0 0 0  Easily annoyed or irritable 3 2 1 1   Afraid - awful might happen 1 0 0 0  Total GAD 7 Score 17 6 6 3   Anxiety Difficulty Very difficult Somewhat difficult Somewhat difficult Not difficult at all      Return in about 2 weeks (around 08/09/2023) for recheck/follow-up.    Subjective:    Chief Complaint  Patient presents with   Mass    Under left breast area, swelling x 2 weeks    Arm Pain    Rt arm possible torn muscle after lifting something heavy  Pain on and off x 1 month    Anxiety    Arm Pain   Anxiety     Discussed the use of AI scribe software for clinical note transcription with the patient, who gave verbal consent to proceed.  History of Present Illness Jose Carr is a 33 year old male with anxiety and asthma who presents with increased anxiety and chest tightness.  He has been experiencing significant anxiety, characterized by episodes of tachycardia and panic attacks. Initially, he associated these symptoms with vaping, which he has since discontinued, but the anxiety persists. He feels his medication, Lamictal 100 mg twice daily and Wellbutrin, is less effective, leading to mood instability, irritability, and episodes of rage. He describes feeling overwhelmed and 'like I'm drowning again.' He has tried gabapentin, which sometimes helps with his anxiety, but not consistently.  He reports a bulging mass on his left lower ribs that appeared a couple of weeks ago. The area feels swollen with fluid and inflammation, but not painful. He also notes bruising on his lower abdomen, which he attributes to work-related activities.  He  has asthma and reports increased wheezing and chest tightness since returning to smoking cigarettes. He uses an albuterol inhaler but finds it exacerbates his symptoms. He has not been using any steroid inhalers recently. He smokes cigarettes, having switched to all-natural cigarettes to reduce preservatives. He has reduced his smoking but finds it challenging to quit completely. He has tried nicotine patches but found they exacerbated his anxiety.  He has a family history of heart disease, with his father and paternal grandparents having died of heart attacks. 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  He describes significant stress in his personal life, including  financial pressures, relationship challenges, and responsibilities as a caretaker for his children and partner. He feels like he is failing despite his efforts to manage his responsibilities.     Past Medical History:  Diagnosis Date   ADHD (attention deficit hyperactivity disorder)    Anxiety    Asthma    COPD (chronic obstructive pulmonary disease) (HCC)    Depression    Substance abuse (HCC)     Past Surgical History:  Procedure Laterality Date   CARPAL TUNNEL RELEASE     HAND SURGERY     HAND SURGERY     KNEE SURGERY     KNEE SURGERY      Family History  Problem Relation Age of Onset   COPD Father    Hypertension Father    Bipolar disorder Father    Schizophrenia Father    Heart attack Father    Bipolar disorder Sister    COPD Paternal Grandmother    Heart attack Paternal Grandmother    COPD Paternal Grandfather     Social History   Tobacco Use   Smoking status: Every Day    Current packs/day: 2.00    Types: Cigarettes   Smokeless tobacco: Never  Vaping Use   Vaping status: Some Days  Substance Use Topics   Alcohol use: Yes    Alcohol/week: 14.0 standard drinks of alcohol     Types: 14 Cans of beer per week    Comment: 2 tall boys after work each morning   Drug use: Yes    Types: Marijuana    Comment: daily use     No Known Allergies  Review of Systems NEGATIVE UNLESS OTHERWISE INDICATED IN HPI      Objective:     BP 126/74   Pulse 75   Temp 98.2 F (36.8 C) (Temporal)   Ht 5\' 7"  (1.702 m)   Wt 197 lb (89.4 kg)   SpO2 97%   BMI 30.85 kg/m   Wt Readings from Last 3 Encounters:  07/26/23 197 lb (89.4 kg)  06/01/23 199 lb 9.6 oz (90.5 kg)  04/05/23 207 lb (93.9 kg)    BP Readings from Last 3 Encounters:  07/26/23 126/74  06/01/23 110/70  04/05/23 124/70     Physical Exam Vitals and nursing note reviewed.  Constitutional:      General: He is not in acute distress.    Appearance: Normal appearance. He is not ill-appearing.  HENT:     Head: Normocephalic.     Right Ear: External ear normal.     Left Ear: External ear normal.  Eyes:     Extraocular Movements: Extraocular movements intact.     Conjunctiva/sclera: Conjunctivae normal.     Pupils: Pupils are equal, round, and reactive to light.  Cardiovascular:     Rate and Rhythm: Normal rate and regular rhythm.     Pulses: Normal pulses.     Heart sounds: Normal heart sounds. No murmur heard. Pulmonary:     Effort: Pulmonary effort is normal. No respiratory distress.     Breath sounds: Wheezing (throughout) present.  Chest:    Musculoskeletal:     Cervical back: Normal range of motion.  Skin:    General: Skin is warm.     Findings: No rash.     Comments: Green-yellow ecchymosis left lower abdomen  Neurological:     Mental Status: He is alert and oriented to person, place, and time.  Psychiatric:        Mood and  Affect: Mood is anxious and depressed. Affect is tearful.        Thought Content: Thought content does not include homicidal or suicidal plan.          Time Spent: 54 minutes of total time was spent on the date of the encounter performing the following  actions: chart review prior to seeing the patient, obtaining history, performing a medically necessary exam, counseling on the treatment plan, placing orders, and documenting in our EHR.     Trayson Stitely M Breona Cherubin, PA-C

## 2023-07-27 ENCOUNTER — Encounter: Payer: Self-pay | Admitting: Physician Assistant

## 2023-07-29 ENCOUNTER — Other Ambulatory Visit: Payer: Self-pay

## 2023-07-29 DIAGNOSIS — J984 Other disorders of lung: Secondary | ICD-10-CM

## 2023-07-29 DIAGNOSIS — J4521 Mild intermittent asthma with (acute) exacerbation: Secondary | ICD-10-CM

## 2023-07-29 DIAGNOSIS — J439 Emphysema, unspecified: Secondary | ICD-10-CM

## 2023-07-29 LAB — LAMOTRIGINE LEVEL: Lamotrigine Lvl: 2.4 ug/mL — ABNORMAL LOW (ref 2.5–15.0)

## 2023-08-09 ENCOUNTER — Ambulatory Visit: Admitting: Physician Assistant

## 2023-08-17 ENCOUNTER — Other Ambulatory Visit: Payer: Self-pay | Admitting: Physician Assistant

## 2023-08-17 ENCOUNTER — Encounter: Payer: Self-pay | Admitting: Physician Assistant

## 2023-08-17 ENCOUNTER — Ambulatory Visit (INDEPENDENT_AMBULATORY_CARE_PROVIDER_SITE_OTHER): Admitting: Physician Assistant

## 2023-08-17 VITALS — BP 106/66 | HR 72 | Temp 98.1°F | Ht 67.0 in | Wt 193.6 lb

## 2023-08-17 DIAGNOSIS — F419 Anxiety disorder, unspecified: Secondary | ICD-10-CM

## 2023-08-17 DIAGNOSIS — J439 Emphysema, unspecified: Secondary | ICD-10-CM | POA: Diagnosis not present

## 2023-08-17 DIAGNOSIS — G5602 Carpal tunnel syndrome, left upper limb: Secondary | ICD-10-CM

## 2023-08-17 DIAGNOSIS — F319 Bipolar disorder, unspecified: Secondary | ICD-10-CM

## 2023-08-17 DIAGNOSIS — F32A Depression, unspecified: Secondary | ICD-10-CM | POA: Diagnosis not present

## 2023-08-17 MED ORDER — BACLOFEN 10 MG PO TABS: 10.0000 mg | ORAL_TABLET | Freq: Two times a day (BID) | ORAL | 0 refills | Status: DC | PRN

## 2023-08-17 MED ORDER — LAMOTRIGINE 25 MG PO TABS: ORAL_TABLET | ORAL | 0 refills | Status: DC

## 2023-08-17 NOTE — Progress Notes (Addendum)
 Patient ID: Jose Carr, male    DOB: Sep 07, 1990, 33 y.o.   MRN: [id number]   Assessment & Plan:  Pulmonary emphysema, unspecified emphysema type (HCC)  Bipolar I disorder (HCC)  Anxiety and depression  Carpal tunnel syndrome of left wrist -     Ambulatory referral to Sports Medicine  Other orders -     Baclofen ; Take 1 tablet (10 mg total) by mouth 2 (two) times daily as needed for muscle spasms (& as needed for anxiety).  Dispense: 60 each; Refill: 0    Assessment & Plan Chest pain intermittent, smoking, emphysema on CXR  Intermittent chest pain associated with smoking, affecting lungs and ribs. Exacerbated by smoking. Chest x-ray reviewed. Pulmonology referral for further evaluation, including pulmonary function tests (PFTs). Smoking cessation discussed for lung health. Flovent  inhaler provides relief; albuterol  inhaler causes jitteriness, used as needed. - Continue Flovent  inhaler twice daily - Use albuterol  inhaler as needed for shortness of breath - Attend pulmonology appointment in May for further evaluation and PFTs - Continue smoking cessation efforts  Carpal tunnel syndrome, left wrist Carpal tunnel syndrome in left wrist with previous surgery on right hand. Symptoms include pain, tingling, and sleep disturbance. Considering surgery but concerned about recovery and work. Steroid injection discussed as interim treatment.  - Consider referral for carpal tunnel surgery if symptoms worsen - Investigate possibility of steroid injection for symptom relief   Mental health  Better today Depression symptoms improved with current medication regimen. Lamotrigine  dose is 100 mg twice daily with an additional 25 mg in the morning. Baclofen  prescribed for anxiety and muscle relaxation, reported as helpful. Cognitive behavioral therapy discussed as a potential future option. Reports increased mood stability with current regimen. - Refill baclofen  prescription for anxiety and muscle  relaxation - Continue current lamotrigine  dosing - Consider cognitive behavioral therapy in the future  Weight loss Weight loss attributed to increased activity and reduced food intake. Reports insufficient eating and avoiding red meat and fried foods. No concerning symptoms such as extreme fatigue, fevers, or night sweats. Blood tests normal. Will continue to monitor.      Return in about 4 months (around 12/17/2023) for recheck/follow-up.    Subjective:    Chief Complaint  Patient presents with   Medical Management of Chronic Issues    Pt in office for 2 wk f/u with PCP; pt says things are better, but still having the chest tightness and lungs in general pain, pt has cut back on smoking; all heart racing and palpitations have subsided.    HPI Discussed the use of AI scribe software for clinical note transcription with the patient, who gave verbal consent to proceed.  History of Present Illness Jose Carr is a 33 year old male who presents with persistent chest pain.  He experiences persistent chest pain, described as a sensation in his lungs that worsens with smoking and feels like 'broken ribs' at its worst. He has significantly reduced cigarette consumption and is using a nicotine  patch. The pain is no longer associated with tightness or palpitations. He uses Flovent , which provides some relief, and Butisol, which he uses sparingly due to side effects like jitteriness.  He has a history of carpal tunnel syndrome, with previous surgery on one hand eight years ago. The other hand is now symptomatic, with pain worsening with use, affecting his sleep and daily activities. He wears a brace to manage symptoms and is considering surgery.  He is on Lamictal , taking 100 mg twice a day and  25 mg in the morning, which he feels has stabilized his mood. He had a rough day around [place] but otherwise feels normal. He previously used baclofen  for two weeks, which he found helpful for calming  down, and is considering a refill.  He has been losing weight, attributing it to increased activity and reduced food intake, particularly red meat and fried foods. He is trying to eat more grilled chicken. He has been more active, going to the gym, and has cut back on smoking and other inhaled substances, opting for a vaporizer instead.  He lives with his children and his girlfriend's child, which has added stress to his life. His girlfriend's child is aggressive, causing some household disruptions. No significant allergy symptoms despite the pollen season. Occasional night sweats are attributed to a heavy blanket. No excessive tiredness or weakness, and his pulse has been stable.     Past Medical History:  Diagnosis Date   ADHD (attention deficit hyperactivity disorder)    Anxiety    Asthma    COPD (chronic obstructive pulmonary disease) (HCC)    Depression    Substance abuse (HCC)     Past Surgical History:  Procedure Laterality Date   CARPAL TUNNEL RELEASE     HAND SURGERY     HAND SURGERY     KNEE SURGERY     KNEE SURGERY      Family History  Problem Relation Age of Onset   COPD Father    Hypertension Father    Bipolar disorder Father    Schizophrenia Father    Heart attack Father    Bipolar disorder Sister    COPD Paternal Grandmother    Heart attack Paternal Grandmother    COPD Paternal Grandfather     Social History   Tobacco Use   Smoking status: Every Day    Current packs/day: 2.00    Types: Cigarettes   Smokeless tobacco: Never  Vaping Use   Vaping status: Some Days  Substance Use Topics   Alcohol use: Yes    Alcohol/week: 14.0 standard drinks of alcohol    Types: 14 Cans of beer per week    Comment: 2 tall boys after work each morning   Drug use: Yes    Types: Marijuana    Comment: daily use     No Known Allergies  Review of Systems NEGATIVE UNLESS OTHERWISE INDICATED IN HPI      Objective:     BP 106/66 (BP Location: Left Arm, Patient  Position: Sitting, Cuff Size: Normal)   Pulse 72   Temp 98.1 F (36.7 C) (Temporal)   Ht 5\' 7"  (1.702 m)   Wt 193 lb 9.6 oz (87.8 kg)   SpO2 97%   BMI 30.32 kg/m   Wt Readings from Last 3 Encounters:  08/17/23 193 lb 9.6 oz (87.8 kg)  07/26/23 197 lb (89.4 kg)  06/01/23 199 lb 9.6 oz (90.5 kg)    BP Readings from Last 3 Encounters:  08/17/23 106/66  07/26/23 126/74  06/01/23 110/70     Physical Exam Vitals and nursing note reviewed.  Constitutional:      General: He is not in acute distress.    Appearance: Normal appearance. He is not ill-appearing.  HENT:     Head: Normocephalic.     Right Ear: External ear normal.     Left Ear: External ear normal.  Eyes:     Extraocular Movements: Extraocular movements intact.     Conjunctiva/sclera: Conjunctivae normal.  Pupils: Pupils are equal, round, and reactive to light.  Cardiovascular:     Rate and Rhythm: Normal rate and regular rhythm.     Pulses: Normal pulses.     Heart sounds: Normal heart sounds. No murmur heard. Pulmonary:     Effort: Pulmonary effort is normal. No respiratory distress.     Breath sounds: Normal breath sounds. No wheezing.  Musculoskeletal:     Cervical back: Normal range of motion.     Comments: Wearing brace on L wrist   Skin:    General: Skin is warm.  Neurological:     Mental Status: He is alert and oriented to person, place, and time.  Psychiatric:        Mood and Affect: Mood normal.        Behavior: Behavior normal.             Javarian Jakubiak M Yalitza Teed, PA-C

## 2023-08-23 ENCOUNTER — Encounter: Payer: Self-pay | Admitting: Physician Assistant

## 2023-08-23 ENCOUNTER — Other Ambulatory Visit: Payer: Self-pay | Admitting: Physician Assistant

## 2023-08-23 ENCOUNTER — Ambulatory Visit: Admitting: Family Medicine

## 2023-08-23 DIAGNOSIS — F1910 Other psychoactive substance abuse, uncomplicated: Secondary | ICD-10-CM

## 2023-08-23 DIAGNOSIS — F319 Bipolar disorder, unspecified: Secondary | ICD-10-CM

## 2023-08-23 NOTE — Progress Notes (Deleted)
   [name], PhD, LAT, ATC acting as a scribe for Garlan Juniper, MD.  Jose Carr is a 33 y.o. male who presents to [facility] at [facility] today for L wrist pain x ***. Hx of CTS and surgery in his R wrist. Pt locates pain to ***  Radiates: Paresthesia: Grip strength: Aggravates: Treatments tried:  Pertinent review of systems: ***  Relevant historical information: ***   Exam:  There were no vitals taken for this visit. General: Well Developed, well nourished, and in no acute distress.   MSK: ***    Lab and Radiology Results No results found for this or any previous visit (from the past 72 hours). No results found.     Assessment and Plan: 33 y.o. male with ***   PDMP not reviewed this encounter. No orders of the defined types were placed in this encounter.  No orders of the defined types were placed in this encounter.    Discussed warning signs or symptoms. Please see discharge instructions. Patient expresses understanding.   ***

## 2023-08-27 NOTE — Progress Notes (Unsigned)
   [name], PhD, LAT, ATC acting as a scribe for Jose Juniper, MD.  Jose Carr is a 33 y.o. male who presents to [facility] at [facility] today for L wrist pain x 8+ years, worsening recently. Hx of CTS and surgery in his R wrist. He works as a Financial risk analyst. He is RHD. Pt locates pain to across the wrist joint w/ paresthesia throughout hand. Pain is waking him up at night.  Radiates: yes Paresthesia: yes Grip strength: diminished, esp w/ increased use Aggravates: holding spatulas, repetitive gripping, playing video games Treatments tried: wrist brace,   Pertinent review of systems: No fevers or chills  Relevant historical information: Bipolar disorder currently well-controlled on Lamictal . Smoker.  Previously smoked about 2 packs a day down to a half a pack a day using nicotine  patches to try to quit smoking. History of right carpal tunnel syndrome requiring surgery.  Exam:  BP 134/76   Pulse 68   Ht 5\' 7"  (1.702 m)   Wt 198 lb (89.8 kg)   SpO2 96%   BMI 31.01 kg/m  General: Well Developed, well nourished, and in no acute distress.   MSK: Left wrist wearing wrist brace otherwise normal. Normal motion and strength.  Positive Tinel's at carpal tunnel.    Lab and Radiology Results  Carpal tunnel median nerve hydrodissection Procedure: Real-time Ultrasound Guided median nerve hydrodissection and carpal tunnel injection left Device: Philips Affiniti 50G/GE Logiq Images permanently stored and available for review in PACS Verbal informed consent obtained.  Discussed risks and benefits of procedure. Warned about infection, bleeding, hyperglycemia damage to structures among others. Patient expresses understanding and agreement Time-out conducted.   Noted no overlying erythema, induration, or other signs of local infection.   Skin prepped in a sterile fashion.   Local anesthesia: Topical Ethyl chloride.   With sterile technique and under real time ultrasound  guidance: 40 mg of Kenalog  and 1 mL of lidocaine  injected into carpal tunnel around the median nerve. Fluid seen entering the carpal tunnel.   Completed without difficulty   Pain immediately resolved suggesting accurate placement of the medication.   Advised to call if fevers/chills, erythema, induration, drainage, or persistent bleeding.   Images permanently stored and available for review in the ultrasound unit.  Impression: Technically successful ultrasound guided injection.       Assessment and Plan: 33 y.o. male with left carpal tunnel syndrome.  Already trying conservative management with a wrist brace which is ineffective.  Plan for injection today.  Continue wrist brace and I have prescribed gabapentin to use primarily at bedtime.  If not sufficient he will let me know and next step would typically be nerve conduction study.   PDMP not reviewed this encounter. Orders Placed This Encounter  Procedures   US  LIMITED JOINT SPACE STRUCTURES UP LEFT(NO LINKED CHARGES)    Reason for Exam (SYMPTOM  OR DIAGNOSIS REQUIRED):   left wrist pain    Preferred imaging location?:   [facility] ordered this encounter  Medications   gabapentin (NEURONTIN) 300 MG capsule    Sig: Take 1-2 capsules (300-600 mg total) by mouth at bedtime as needed (nerve pain).    Dispense:  90 capsule    Refill:  3     Discussed warning signs or symptoms. Please see discharge instructions. Patient expresses understanding.   The above documentation has been reviewed and is accurate and complete Jose Carr, M.D.

## 2023-08-30 ENCOUNTER — Ambulatory Visit (INDEPENDENT_AMBULATORY_CARE_PROVIDER_SITE_OTHER): Admitting: Family Medicine

## 2023-08-30 ENCOUNTER — Other Ambulatory Visit: Payer: Self-pay

## 2023-08-30 VITALS — BP 134/76 | HR 68 | Ht 67.0 in | Wt 198.0 lb

## 2023-08-30 DIAGNOSIS — M25532 Pain in left wrist: Secondary | ICD-10-CM

## 2023-08-30 DIAGNOSIS — G5602 Carpal tunnel syndrome, left upper limb: Secondary | ICD-10-CM | POA: Insufficient documentation

## 2023-08-30 MED ORDER — GABAPENTIN 300 MG PO CAPS: 300.0000 mg | ORAL_CAPSULE | Freq: Every evening | ORAL | 3 refills | Status: DC | PRN

## 2023-08-30 NOTE — Patient Instructions (Addendum)
 Thank you for coming in today.   You received an injection today. Seek immediate medical attention if the joint becomes red, extremely painful, or is oozing fluid.   Gabapentin at bedtime as needed for nerve pain.   Continue the patch. If you need more nicotine  ok to use gum or lozenges for those craving moments.

## 2023-09-16 ENCOUNTER — Ambulatory Visit: Admitting: Pulmonary Disease

## 2023-09-22 ENCOUNTER — Encounter: Payer: Self-pay | Admitting: Physician Assistant

## 2023-09-22 ENCOUNTER — Other Ambulatory Visit: Payer: Self-pay | Admitting: Physician Assistant

## 2023-09-22 ENCOUNTER — Ambulatory Visit: Admitting: Pulmonary Disease

## 2023-09-22 ENCOUNTER — Encounter: Payer: Self-pay | Admitting: Pulmonary Disease

## 2023-09-22 VITALS — BP 128/53 | HR 70 | Temp 97.6°F | Ht 67.0 in | Wt 197.0 lb

## 2023-09-22 DIAGNOSIS — R079 Chest pain, unspecified: Secondary | ICD-10-CM | POA: Diagnosis not present

## 2023-09-22 DIAGNOSIS — J432 Centrilobular emphysema: Secondary | ICD-10-CM

## 2023-09-22 DIAGNOSIS — F17210 Nicotine dependence, cigarettes, uncomplicated: Secondary | ICD-10-CM

## 2023-09-22 MED ORDER — FLUTICASONE-SALMETEROL 230-21 MCG/ACT IN AERO: 2.0000 | INHALATION_SPRAY | Freq: Two times a day (BID) | RESPIRATORY_TRACT | 12 refills | Status: AC

## 2023-09-22 NOTE — Patient Instructions (Signed)
 Nice to meet you  I worry there is something irritating the lungs from smoke causing the discomfort in your chest  Lets use Advair 2 puffs twice a day every day, rinse your mouth out with water after every use.  This adds a long-acting bronchodilator, long-acting version of albuterol , to the Flovent -lets see if this gives you more relief.  I ordered a CT scan of your chest to further evaluate your chest pain, your chest x-ray is clear which is good but lets make sure were not missing something else.  Return to clinic in 3 months or sooner as needed with Dr. Marygrace Snellen, I will get results to you via MyChart or phone as we go, if we need to see each other sooner we can do so

## 2023-09-22 NOTE — Progress Notes (Signed)
 @Patient  ID: Jose Carr, male    DOB: [birth year], 33 y.o.   MRN: [id number]  Chief Complaint  Patient presents with   Consult    Emphysema & Asthma - Chest pain and productive cough (yellow)    Referring provider: Allwardt, Deleta Felix, PA-C  HPI:   33 y.o. man whom we are seeing for evaluation of chronic chest pain with reported history of asthma, reported imaging findings of emphysema.  Most recent PCP note times x 3 reviewed.  Patient with many months of chest pain.  Areas scattered across both sides of chest.  Not substernal.  Reproduced with exertion, usually not exertional without.  Not reliably reproduced with inspiration.  Occasionally pruritic but not reliably so.  Reliably worsened with cumulative effect or the amount of smoking he does not cigarettes.  The more smoking the more likely the pain will come on.  Or worse.  No position make things better or worse.  No seasonal or environmental factors he can identify the main things that are worse.  Albuterol  seems to provide some relief.  Has been on Flovent .  This does not seem to have helped really at all.  Mid dose of Flovent  HFA.  As part of workup he had a chest x-ray 06/2023 on my review interpretation reveals hyperinflated lungs, otherwise clear.  Most recent EKG 10/2021 reveals normal sinus rhythm on my review interpretation.  Questionaires / Pulmonary Flowsheets:   ACT:  Asthma Control Test ACT Total Score  09/22/2023 12:55 PM 19    MMRC:     No data to display          Epworth:      No data to display          Tests:   FENO:  No results found for: "NITRICOXIDE"  PFT:     No data to display          WALK:      No data to display          Imaging: Personally reviewed as per EMR and discussion in this note No results found.  Lab Results: Personally reviewed CBC    Component Value Date/Time   WBC 8.8 07/26/2023 1418   RBC 4.87 07/26/2023 1418   HGB 15.0 07/26/2023 1418   HGB 16.0  10/07/2018 0958   HCT 44.0 07/26/2023 1418   HCT 47.4 10/07/2018 0958   PLT 227.0 07/26/2023 1418   PLT 222 10/07/2018 0958   MCV 90.3 07/26/2023 1418   MCV 92 10/07/2018 0958   MCH 30.6 11/21/2021 1210   MCHC 34.2 07/26/2023 1418   RDW 13.2 07/26/2023 1418   RDW 12.9 10/07/2018 0958   LYMPHSABS 1.5 07/26/2023 1418   MONOABS 0.7 07/26/2023 1418   EOSABS 0.4 07/26/2023 1418   BASOSABS 0.1 07/26/2023 1418    BMET    Component Value Date/Time   NA 140 07/26/2023 1418   NA 143 10/07/2018 0958   K 4.0 07/26/2023 1418   CL 104 07/26/2023 1418   CO2 27 07/26/2023 1418   GLUCOSE 89 07/26/2023 1418   BUN 16 07/26/2023 1418   BUN 16 10/07/2018 0958   CREATININE 0.92 07/26/2023 1418   CALCIUM 9.3 07/26/2023 1418   GFRNONAA >60 11/21/2021 1210   GFRAA 136 10/07/2018 0958    BNP No results found for: "BNP"  ProBNP    Component Value Date/Time   PROBNP 47 10/07/2018 0958    Specialty Problems   None   No Known  Allergies  Immunization History  Administered Date(s) Administered   DTP 12/20/1988, 10/08/1989, 02/06/1991, 03/15/1993, 11/11/1993   Hep B, Unspecified 01/12/2000, 02/16/2000, 07/12/2000   MMR 02/06/1991, 02/11/1995   OPV 12/20/1988, 10/08/1989, 02/06/1991, 03/15/1993, 11/11/1993   Tdap 04/05/2023    Past Medical History:  Diagnosis Date   ADHD (attention deficit hyperactivity disorder)    Anxiety    Asthma    COPD (chronic obstructive pulmonary disease) (HCC)    Depression    Substance abuse (HCC)     Tobacco History: Social History   Tobacco Use  Smoking Status Every Day   Current packs/day: 2.00   Types: Cigarettes  Smokeless Tobacco Never   Ready to quit: No Counseling given: No   Continue to not smoke  Outpatient Encounter Medications as of 09/22/2023  Medication Sig   albuterol  (PROVENTIL  HFA;VENTOLIN  HFA) 108 (90 Base) MCG/ACT inhaler Inhale 1-2 puffs into the lungs every 6 (six) hours as needed for wheezing or shortness of breath.    buPROPion  (WELLBUTRIN  XL) 300 MG 24 hr tablet Take 1 tablet (300 mg total) by mouth daily.   fluticasone  (FLOVENT  HFA) 110 MCG/ACT inhaler Inhale 1 puff into the lungs 2 (two) times daily. Rinse mouth after use.   fluticasone -salmeterol (ADVAIR HFA) 230-21 MCG/ACT inhaler Inhale 2 puffs into the lungs 2 (two) times daily.   gabapentin  (NEURONTIN ) 300 MG capsule Take 1-2 capsules (300-600 mg total) by mouth at bedtime as needed (nerve pain).   lamoTRIgine  (LAMICTAL ) 25 MG tablet Take one tab po with AM dose of lamictal  100 mg tablet.   nicotine  (NICODERM CQ  - DOSED IN MG/24 HOURS) 14 mg/24hr patch Place 1 patch (14 mg total) onto the skin daily.   lamoTRIgine  (LAMICTAL ) 100 MG tablet Take 1 tablet (100 mg total) by mouth 2 (two) times daily.   No facility-administered encounter medications on file as of 09/22/2023.     Review of Systems  Review of Systems  No chest pain with exertion.  Orthopnea or PND.  Comprehensive review of systems otherwise negative. Physical Exam  BP (!) 128/53 (BP Location: Left Arm, Patient Position: Sitting, Cuff Size: Large)   Pulse 70   Temp 97.6 F (36.4 C)   Ht 5\' 7"  (1.702 m)   Wt 197 lb (89.4 kg)   SpO2 95%   BMI 30.85 kg/m   Wt Readings from Last 5 Encounters:  09/22/23 197 lb (89.4 kg)  08/30/23 198 lb (89.8 kg)  08/17/23 193 lb 9.6 oz (87.8 kg)  07/26/23 197 lb (89.4 kg)  06/01/23 199 lb 9.6 oz (90.5 kg)    BMI Readings from Last 5 Encounters:  09/22/23 30.85 kg/m  08/30/23 31.01 kg/m  08/17/23 30.32 kg/m  07/26/23 30.85 kg/m  06/01/23 31.26 kg/m     Physical Exam General: Sitting in chair, no acute distress Eyes: EOMI, no icterus Neck: Supple, no JVP Pulmonary: Clear, normal work of breathing Abdomen: Nondistended Cardiovascular: Warm, no edema MSK: No synovitis, no synovitis Neuro: Normal gait, no weakness Psych: Normal mood, full affect   Assessment & Plan:   Chest pain: Not reliably reproduced with exertion nor  inspiration, occasionally pleuritic but not reliably so.  Reliably worsens when smoking, cumulative effect of amount of smoke.  Suspect local irritation related to smoke.  Advised him to continue to cut back and eventually strict abstinence to all forms of smoking.  Chest x-ray is clear.  CT scan for further evaluation.  Trial of Advair to see if this helps.  Albuterol  seems to  help more than inhaled steroid.  Okay to stop Flovent  while on Advair.  Emphysema: As reported on chest x-ray.  Emergency to my area.  Certainly hyperinflated.  CT scan as above for further evaluation.   Return in about 3 months (around 12/23/2023) for f/u Dr. Marygrace Snellen.   Guerry Leek, MD 09/22/2023

## 2023-09-23 ENCOUNTER — Other Ambulatory Visit: Payer: Self-pay

## 2023-09-23 ENCOUNTER — Other Ambulatory Visit: Payer: Self-pay | Admitting: Physician Assistant

## 2023-09-23 ENCOUNTER — Telehealth: Payer: Self-pay

## 2023-09-23 DIAGNOSIS — F1910 Other psychoactive substance abuse, uncomplicated: Secondary | ICD-10-CM

## 2023-09-23 DIAGNOSIS — F319 Bipolar disorder, unspecified: Secondary | ICD-10-CM

## 2023-09-23 MED ORDER — LAMOTRIGINE 100 MG PO TABS: 100.0000 mg | ORAL_TABLET | Freq: Two times a day (BID) | ORAL | 2 refills | Status: DC

## 2023-09-23 NOTE — Telephone Encounter (Signed)
 Had to change location of CT due to insurance. Called to tell him new location of his CT and when. Pt is aware and understanding. NFN

## 2023-09-23 NOTE — Telephone Encounter (Signed)
 Copied from CRM [id number]. Topic: Clinical - Medication Refill >> Sep 23, 2023 10:36 AM Baldo Levan wrote: Medication: lamoTRIgine  lamoTRIgine  (LAMICTAL ) 25 MG tablet   lamoTRIgine  Expired - lamoTRIgine  (LAMICTAL ) 100 MG tablet  Baclofen - Not seeing this on patients med. list    Has the patient contacted their pharmacy? Yes (Agent: If no, request that the patient contact the pharmacy for the refill. If patient does not wish to contact the pharmacy document the reason why and proceed with request.) (Agent: If yes, when and what did the pharmacy advise?)  This is the patient's preferred pharmacy:  [facility] [address], [place] - [address] [facility] [place] [address] Phone: [phone] Fax: [phone]   Is this the correct pharmacy for this prescription? Yes If no, delete pharmacy and type the correct one.   Has the prescription been filled recently? No  Is the patient out of the medication? Yes  Has the patient been seen for an appointment in the last year OR does the patient have an upcoming appointment? Yes  Can we respond through MyChart? Yes  Agent: Please be advised that Rx refills may take up to 3 business days. We ask that you follow-up with your pharmacy.

## 2023-09-23 NOTE — Telephone Encounter (Signed)
 Copied from CRM [id number]. Topic: General - Call Back - No Documentation >> Sep 23, 2023  8:53 AM Hilton Lucky wrote: Reason for CRM: Call Back - No Documenation Patient did not check for a message and called back after immediately missing call. Patient requesting call back but notes he is at work.  It appears that patient is returning a call from [facility] in reference to scheduled CT.  PCC's, please advise.

## 2023-09-30 ENCOUNTER — Encounter (HOSPITAL_COMMUNITY): Payer: Self-pay

## 2023-10-01 ENCOUNTER — Ambulatory Visit (HOSPITAL_COMMUNITY)

## 2023-10-01 ENCOUNTER — Other Ambulatory Visit (HOSPITAL_BASED_OUTPATIENT_CLINIC_OR_DEPARTMENT_OTHER): Admitting: Radiology

## 2023-10-04 ENCOUNTER — Ambulatory Visit: Payer: Medicaid Other | Admitting: Physician Assistant

## 2023-10-18 ENCOUNTER — Other Ambulatory Visit (HOSPITAL_BASED_OUTPATIENT_CLINIC_OR_DEPARTMENT_OTHER): Admitting: Radiology

## 2023-10-20 ENCOUNTER — Other Ambulatory Visit: Payer: Self-pay | Admitting: Physician Assistant

## 2023-10-25 ENCOUNTER — Ambulatory Visit (HOSPITAL_BASED_OUTPATIENT_CLINIC_OR_DEPARTMENT_OTHER)
Admission: RE | Admit: 2023-10-25 | Discharge: 2023-10-25 | Disposition: A | Discharge status: Home / Self Care | Source: Ambulatory Visit | Attending: Pulmonary Disease | Admitting: Pulmonary Disease

## 2023-10-25 DIAGNOSIS — F17210 Nicotine dependence, cigarettes, uncomplicated: Secondary | ICD-10-CM | POA: Diagnosis not present

## 2023-10-25 DIAGNOSIS — R079 Chest pain, unspecified: Secondary | ICD-10-CM

## 2023-10-25 DIAGNOSIS — J432 Centrilobular emphysema: Secondary | ICD-10-CM

## 2023-10-31 ENCOUNTER — Ambulatory Visit: Payer: Self-pay | Admitting: Pulmonary Disease

## 2023-11-18 ENCOUNTER — Other Ambulatory Visit: Payer: Self-pay | Admitting: Physician Assistant

## 2023-11-20 ENCOUNTER — Other Ambulatory Visit: Payer: Self-pay | Admitting: Physician Assistant

## 2023-11-20 DIAGNOSIS — F17200 Nicotine dependence, unspecified, uncomplicated: Secondary | ICD-10-CM

## 2023-12-17 ENCOUNTER — Other Ambulatory Visit: Payer: Self-pay | Admitting: Physician Assistant

## 2023-12-20 ENCOUNTER — Ambulatory Visit: Admitting: Physician Assistant

## 2024-01-04 ENCOUNTER — Encounter: Payer: Self-pay | Admitting: Physician Assistant

## 2024-01-04 ENCOUNTER — Ambulatory Visit: Admitting: Physician Assistant

## 2024-01-04 VITALS — BP 120/70 | HR 82 | Temp 98.6°F | Ht 67.0 in | Wt 211.8 lb

## 2024-01-04 DIAGNOSIS — F319 Bipolar disorder, unspecified: Secondary | ICD-10-CM | POA: Diagnosis not present

## 2024-01-04 DIAGNOSIS — F17200 Nicotine dependence, unspecified, uncomplicated: Secondary | ICD-10-CM | POA: Diagnosis not present

## 2024-01-04 DIAGNOSIS — M79601 Pain in right arm: Secondary | ICD-10-CM | POA: Diagnosis not present

## 2024-01-04 DIAGNOSIS — Z23 Encounter for immunization: Secondary | ICD-10-CM | POA: Diagnosis not present

## 2024-01-04 MED ORDER — LAMOTRIGINE 25 MG PO TABS: ORAL_TABLET | ORAL | 2 refills | Status: DC

## 2024-01-04 MED ORDER — NICOTINE 21 MG/24HR TD PT24: 21.0000 mg | MEDICATED_PATCH | Freq: Every day | TRANSDERMAL | 1 refills | Status: AC

## 2024-01-04 NOTE — Progress Notes (Signed)
 Patient ID: Jose Carr, male    DOB: [birth year], 33 y.o.   MRN: [id number]   Assessment & Plan:  Bipolar I disorder (HCC)  Smoking addiction -     Nicotine ; Place 1 patch (21 mg total) onto the skin daily.  Dispense: 30 patch; Refill: 1  Immunization due -     Flu vaccine trivalent PF, 6mos and older(Flulaval,Afluria,Fluarix,Fluzone)  Other orders -     lamoTRIgine ; TAKE 1 TABLET BY MOUTH ONCE DAILY WITH MORNING DOSE  OF  LAMICTAL   100  MG  TABLET  Dispense: 30 tablet; Refill: 2      Assessment and Plan Assessment & Plan Bipolar disorder, unspecified Bipolar disorder is well-managed on the current medication regimen. - Continue Lamictal  100 mg twice daily and 25 mg in the morning. - Continue Wellbutrin  XL 300 mg daily.  Nicotine  dependence Nicotine  dependence persists with increased smoking. Previous use of nicotine  patches reduced smoking but did not achieve cessation. He is interested in quitting smoking again. - Prescribe 21 mg nicotine  patches with refills. - Consider nicotine  lozenges as an adjunct to patches. - Encourage gradual reduction in smoking with the goal of cessation.  Carpal tunnel syndrome, right upper limb with chronic right arm pain Chronic right arm pain and carpal tunnel syndrome with numbness and pain, possibly related to carpal tunnel syndrome or other musculoskeletal issues. He is receiving cortisone shots and is concerned about surgery affecting hand functionality. - Follow up with [facility]. [name] at [facility] for further evaluation and management. - Consider EMG test to assess nerve involvement if recommended by Dr. [name]. - Continue with cortisone shots as needed for symptom relief.      Return in about 6 months (around 07/03/2024) for physical, fasting labs .    Subjective:    Chief Complaint  Patient presents with   Medication Refill    Pt in office for med follow up and refills;     HPI Discussed the use of AI scribe software for  clinical note transcription with the patient, who gave verbal consent to proceed.  History of Present Illness Jose Carr is a 33 year old male who presents for follow-up med management, and on arm pain and smoking cessation.  He experiences ongoing arm pain, particularly in the right arm, described as muscular with occasional pressure in the back of the arm. The pain worsens with lifting heavy objects and has persisted for months, impacting arm strength and functionality. He has symptoms consistent with carpal tunnel syndrome affecting his right hand and is trying to avoid surgery. Cortisone shots have helped alleviate symptoms.  He is working on smoking cessation and has been using nicotine  patches, initially at a higher dose, which helped reduce smoking. However, he has experienced an increase in smoking after stopping the patches. He wants to quit smoking.  His medication regimen includes Lamictal  at 100 mg twice daily and an additional 25 mg in the morning, as well as Wellbutrin  XL 300 mg daily. His partner, who also has bipolar disorder, is on lamotrigine  and has seen improvement in her condition.  He has been evaluated by a pulmonologist and was relieved to learn he does not have COPD. He mentions a previous diagnosis related to a collapsed lung but is unsure of the specifics. He continues to experience some chest pain and has been using inhalers, including Advair, as prescribed.  He has started a second job and his partner is also working. He has several children, including a  newborn who is three months old. His daughter, who is homeschooled, has been a significant help with the baby. He is trying to improve the family's diet by reducing sugary foods and increasing healthy options.  He has a history of ADHD and has noticed attention issues, particularly when not busy. He is hesitant to start medication for ADHD due to potential interactions with his current medications and concerns about  amphetamines.     Past Medical History:  Diagnosis Date   ADHD (attention deficit hyperactivity disorder)    Anxiety    Asthma    COPD (chronic obstructive pulmonary disease) (HCC)    Depression    Substance abuse (HCC)     Past Surgical History:  Procedure Laterality Date   CARPAL TUNNEL RELEASE     HAND SURGERY     HAND SURGERY     KNEE SURGERY     KNEE SURGERY      Family History  Problem Relation Age of Onset   COPD Father    Hypertension Father    Bipolar disorder Father    Schizophrenia Father    Heart attack Father    Bipolar disorder Sister    COPD Paternal Grandmother    Heart attack Paternal Grandmother    COPD Paternal Grandfather     Social History   Tobacco Use   Smoking status: Every Day    Current packs/day: 2.00    Types: Cigarettes   Smokeless tobacco: Never  Vaping Use   Vaping status: Some Days  Substance Use Topics   Alcohol use: Yes    Alcohol/week: 14.0 standard drinks of alcohol    Types: 14 Cans of beer per week    Comment: 2 tall boys after work each morning   Drug use: Yes    Types: Marijuana    Comment: daily use     No Known Allergies  Review of Systems NEGATIVE UNLESS OTHERWISE INDICATED IN HPI      Objective:     BP 120/70 (BP Location: Left Arm, Patient Position: Sitting, Cuff Size: Normal)   Pulse 82   Temp 98.6 F (37 C) (Temporal)   Ht 5' 7 (1.702 m)   Wt 211 lb 12.8 oz (96.1 kg)   SpO2 97%   BMI 33.17 kg/m   Wt Readings from Last 3 Encounters:  01/04/24 211 lb 12.8 oz (96.1 kg)  09/22/23 197 lb (89.4 kg)  08/30/23 198 lb (89.8 kg)    BP Readings from Last 3 Encounters:  01/04/24 120/70  09/22/23 (!) 128/53  08/30/23 134/76     Physical Exam Vitals and nursing note reviewed.  Constitutional:      Appearance: Normal appearance. He is obese.  Cardiovascular:     Rate and Rhythm: Normal rate and regular rhythm.     Pulses: Normal pulses.     Heart sounds: No murmur heard. Pulmonary:      Effort: Pulmonary effort is normal.     Breath sounds: Normal breath sounds.  Neurological:     General: No focal deficit present.     Mental Status: He is alert.  Psychiatric:        Mood and Affect: Mood normal.        Behavior: Behavior normal.             Dietrich Ke M Jalexa Pifer, PA-C

## 2024-01-04 NOTE — Patient Instructions (Signed)
  VISIT SUMMARY: You came in for a follow-up on your arm pain and smoking cessation. We discussed your ongoing right arm pain, which worsens with heavy lifting, and your efforts to quit smoking using nicotine  patches. We also reviewed your current medications and overall health, including your well-managed bipolar disorder and recent evaluation by a pulmonologist.  YOUR PLAN: BIPOLAR DISORDER: Your bipolar disorder is well-managed with your current medications. -Continue taking Lamictal  100 mg twice daily and 25 mg in the morning. -Continue taking Wellbutrin  XL 300 mg daily.  NICOTINE  DEPENDENCE: You are working on quitting smoking and have used nicotine  patches in the past. -Start using 21 mg nicotine  patches with refills. -Consider using nicotine  lozenges along with the patches. -Gradually reduce smoking with the goal of quitting completely.  CARPAL TUNNEL SYNDROME AND CHRONIC RIGHT ARM PAIN: You have chronic right arm pain and symptoms of carpal tunnel syndrome, and you are receiving cortisone shots. -Follow up with Dr. [name] at [facility] for further evaluation and management. -Consider an EMG test to assess nerve involvement if recommended by Dr. [name]. -Continue with cortisone shots as needed for symptom relief.                      Contains text generated by [employer].                                 Contains text generated by [employer].

## 2024-01-10 ENCOUNTER — Ambulatory Visit: Admitting: Family Medicine

## 2024-01-10 NOTE — Progress Notes (Deleted)
   LILLETTE Ileana Collet, PhD, LAT, ATC acting as a scribe for Artist Lloyd, MD.  Jose Carr is a 33 y.o. male who presents to [facility] at [facility] today for R***L wrist pain. Pt was last seen by Dr. Lloyd on 08/30/23 for L wrist pain and was given a L carpal tunnel steroid injection, prescribed gabapentin , and advised to cont wrist brace.  Today, pt reports ***  Pertinent review of systems: ***  Relevant historical information: ***   Exam:  There were no vitals taken for this visit. General: Well Developed, well nourished, and in no acute distress.   MSK: ***    Lab and Radiology Results No results found for this or any previous visit (from the past 72 hours). No results found.     Assessment and Plan: 33 y.o. male with ***   PDMP not reviewed this encounter. No orders of the defined types were placed in this encounter.  No orders of the defined types were placed in this encounter.    Discussed warning signs or symptoms. Please see discharge instructions. Patient expresses understanding.   ***

## 2024-01-17 ENCOUNTER — Encounter: Payer: Self-pay | Admitting: Family Medicine

## 2024-01-24 ENCOUNTER — Other Ambulatory Visit: Payer: Self-pay | Admitting: Physician Assistant

## 2024-01-24 DIAGNOSIS — F319 Bipolar disorder, unspecified: Secondary | ICD-10-CM

## 2024-01-24 DIAGNOSIS — F1910 Other psychoactive substance abuse, uncomplicated: Secondary | ICD-10-CM

## 2024-01-27 ENCOUNTER — Other Ambulatory Visit: Payer: Self-pay | Admitting: Physician Assistant

## 2024-01-27 DIAGNOSIS — F319 Bipolar disorder, unspecified: Secondary | ICD-10-CM

## 2024-01-27 DIAGNOSIS — F1910 Other psychoactive substance abuse, uncomplicated: Secondary | ICD-10-CM

## 2024-01-27 NOTE — Telephone Encounter (Signed)
 Copied from CRM [id number]. Topic: Clinical - Medication Refill >> Jan 27, 2024  2:50 PM Paige D wrote: Medication: lamoTRIgine  (LAMICTAL ) 100 MG tablet  Has the patient contacted their pharmacy? Yes (Agent: If no, request that the patient contact the pharmacy for the refill. If patient does not wish to contact the pharmacy document the reason why and proceed with request.) (Agent: If yes, when and what did the pharmacy advise?)  This is the patient's preferred pharmacy:  [facility] [address] [place] - [address] [facility] [place] [address] Phone: [phone] Fax: [phone]  Is this the correct pharmacy for this prescription? Yes If no, delete pharmacy and type the correct one.   Has the prescription been filled recently? No  Is the patient out of the medication? Yes  Has the patient been seen for an appointment in the last year OR does the patient have an upcoming appointment? Yes  Can we respond through MyChart? Yes  Agent: Please be advised that Rx refills may take up to 3 business days. We ask that you follow-up with your pharmacy.

## 2024-01-31 ENCOUNTER — Ambulatory Visit: Admitting: Family Medicine

## 2024-01-31 NOTE — Progress Notes (Deleted)
   LILLETTE Ileana Collet, PhD, LAT, ATC acting as a scribe for Artist Lloyd, MD.  Jose Carr is a 33 y.o. male who presents to [facility] at [facility] today for hand pain?***. Pt was previously seen by Dr. Lloyd on 08/30/23 for L CTS and was given a steroid injection.  Today, pt reports ***  Pertinent review of systems: ***  Relevant historical information: ***   Exam:  There were no vitals taken for this visit. General: Well Developed, well nourished, and in no acute distress.   MSK: ***    Lab and Radiology Results No results found for this or any previous visit (from the past 72 hours). No results found.     Assessment and Plan: 33 y.o. male with ***   PDMP not reviewed this encounter. No orders of the defined types were placed in this encounter.  No orders of the defined types were placed in this encounter.    Discussed warning signs or symptoms. Please see discharge instructions. Patient expresses understanding.   ***

## 2024-02-07 ENCOUNTER — Other Ambulatory Visit: Payer: Self-pay

## 2024-02-07 ENCOUNTER — Ambulatory Visit: Admitting: Family Medicine

## 2024-02-07 VITALS — BP 130/60 | HR 66 | Ht 67.0 in | Wt 210.0 lb

## 2024-02-07 DIAGNOSIS — G5602 Carpal tunnel syndrome, left upper limb: Secondary | ICD-10-CM

## 2024-02-07 DIAGNOSIS — M79642 Pain in left hand: Secondary | ICD-10-CM | POA: Diagnosis not present

## 2024-02-07 NOTE — Progress Notes (Signed)
   I, Claretha Schimke am a scribe for Dr. Artist Lloyd, MD.  Jose Carr is a 33 y.o. male who presents to [facility] at [facility] today for hand pain. Pt was last seen by Dr. Lloyd on 08/30/23 for left carpal tunnel syndrome. Pt received Carpal Tunnel injection, advised to continue wrist brace, prescribed Gabapentin , and advised if no improvement consider EMG/NCV.   Today, patient reports that the carpal tunnel pain is back. The injections helped a lot last time. It started back about a month or so ago but couldn't make it back into the office until now.    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  Bipolar disorder well-controlled.   Exam:  BP 130/60   Pulse 66   Ht 5' 7 (1.702 m)   Wt 210 lb (95.3 kg)   SpO2 96%   BMI 32.89 kg/m  General: Well Developed, well nourished, and in no acute distress.   MSK: Left wrist normal-appearing nontender to palpation normal motion and strength.  Positive Tinel's carpal tunnel.    Lab and Radiology Results  Carpal tunnel median nerve hydrodissection Procedure: Real-time Ultrasound Guided median nerve hydrodissection and carpal tunnel injection left Device: Philips Affiniti 50G/GE Logiq Images permanently stored and available for review in PACS Verbal informed consent obtained.  Discussed risks and benefits of procedure. Warned about infection, bleeding, hyperglycemia damage to structures among others. Patient expresses understanding and agreement Time-out conducted.   Noted no overlying erythema, induration, or other signs of local infection.   Skin prepped in a sterile fashion.   Local anesthesia: Topical Ethyl chloride.   With sterile technique and under real time ultrasound guidance: 40 mg of Kenalog  and 1 mL of lidocaine  injected into carpal tunnel around the median nerve. Fluid seen entering the carpal tunnel.   Completed without  difficulty   Pain immediately resolved suggesting accurate placement of the medication.   Advised to call if fevers/chills, erythema, induration, drainage, or persistent bleeding.   Images permanently stored and available for review in the ultrasound unit.  Impression: Technically successful ultrasound guided injection.       Assessment and Plan: 33 y.o. male with left wrist pain due to carpal tunnel syndrome.  This is a chronic problem with recurrence.  Previous injection was about 5 months ago.  Plan for repeat injection today and continued wrist bracing.  Check back as needed.   PDMP not reviewed this encounter. Orders Placed This Encounter  Procedures   US  LIMITED JOINT SPACE STRUCTURES UP LEFT(NO LINKED CHARGES)    Reason for Exam (SYMPTOM  OR DIAGNOSIS REQUIRED):   left wrist pain    Preferred imaging location?:   [facility]   No orders of the defined types were placed in this encounter.    Discussed warning signs or symptoms. Please see discharge instructions. Patient expresses understanding.   The above documentation has been reviewed and is accurate and complete Artist Lloyd, M.D.

## 2024-02-07 NOTE — Patient Instructions (Signed)
 Thank you for coming in today. Injection in left right for carpal tunnel.  Return as needed.

## 2024-03-01 ENCOUNTER — Other Ambulatory Visit: Payer: Self-pay | Admitting: Physician Assistant

## 2024-03-01 DIAGNOSIS — F319 Bipolar disorder, unspecified: Secondary | ICD-10-CM

## 2024-03-01 DIAGNOSIS — F1910 Other psychoactive substance abuse, uncomplicated: Secondary | ICD-10-CM

## 2024-04-01 ENCOUNTER — Other Ambulatory Visit: Payer: Self-pay | Admitting: Physician Assistant

## 2024-04-01 DIAGNOSIS — F1910 Other psychoactive substance abuse, uncomplicated: Secondary | ICD-10-CM

## 2024-04-01 DIAGNOSIS — F319 Bipolar disorder, unspecified: Secondary | ICD-10-CM

## 2024-05-04 ENCOUNTER — Other Ambulatory Visit: Payer: Self-pay | Admitting: Physician Assistant

## 2024-05-04 ENCOUNTER — Other Ambulatory Visit: Payer: Self-pay | Admitting: Family Medicine

## 2024-05-04 DIAGNOSIS — F319 Bipolar disorder, unspecified: Secondary | ICD-10-CM

## 2024-05-04 DIAGNOSIS — F1910 Other psychoactive substance abuse, uncomplicated: Secondary | ICD-10-CM

## 2024-05-04 NOTE — Telephone Encounter (Signed)
 Last OV 02/07/24 Next OV not scheduled  Last refill 08/30/23 Qty #90/3

## 2024-07-10 ENCOUNTER — Encounter: Admitting: Physician Assistant
